# Patient Record
Sex: Female | Born: 1985 | Race: Black or African American | Hispanic: No | Marital: Single | Smoking: Former smoker
Health system: Southern US, Community
[De-identification: ages and names within clinical notes are randomized; demographics above are authoritative.]

## PROBLEM LIST (undated history)

## (undated) DIAGNOSIS — F1910 Other psychoactive substance abuse, uncomplicated: Secondary | ICD-10-CM

## (undated) HISTORY — PX: CHOLECYSTECTOMY: SHX55

---

## 2007-10-11 ENCOUNTER — Emergency Department: Payer: Self-pay | Admitting: Emergency Medicine

## 2008-07-21 ENCOUNTER — Ambulatory Visit: Payer: Self-pay | Admitting: Family Medicine

## 2008-12-19 ENCOUNTER — Observation Stay: Payer: Self-pay | Admitting: Obstetrics and Gynecology

## 2008-12-25 ENCOUNTER — Observation Stay: Payer: Self-pay | Admitting: Obstetrics and Gynecology

## 2008-12-29 ENCOUNTER — Inpatient Hospital Stay: Payer: Self-pay | Admitting: Obstetrics and Gynecology

## 2009-06-14 ENCOUNTER — Emergency Department: Payer: Self-pay | Admitting: Emergency Medicine

## 2009-11-29 ENCOUNTER — Emergency Department: Payer: Self-pay | Admitting: Unknown Physician Specialty

## 2009-12-01 ENCOUNTER — Inpatient Hospital Stay (HOSPITAL_COMMUNITY): Admission: EM | Admit: 2009-12-01 | Discharge: 2009-12-02 | Payer: Self-pay | Admitting: Emergency Medicine

## 2010-02-02 ENCOUNTER — Emergency Department: Payer: Self-pay | Admitting: Emergency Medicine

## 2010-02-03 ENCOUNTER — Emergency Department (HOSPITAL_COMMUNITY)
Admission: EM | Admit: 2010-02-03 | Discharge: 2010-02-03 | Payer: Self-pay | Source: Home / Self Care | Admitting: Emergency Medicine

## 2010-04-08 ENCOUNTER — Emergency Department: Payer: Self-pay | Admitting: Emergency Medicine

## 2010-04-09 ENCOUNTER — Emergency Department: Payer: Self-pay | Admitting: Emergency Medicine

## 2010-04-26 LAB — COMPREHENSIVE METABOLIC PANEL
ALT: 14 U/L (ref 0–35)
AST: 15 U/L (ref 0–37)
Alkaline Phosphatase: 66 U/L (ref 39–117)
CO2: 24 mEq/L (ref 19–32)
GFR calc Af Amer: >60 mL/min (ref >60)
GFR calc non Af Amer: >60 mL/min (ref >60)
Glucose, Bld: 117 mg/dL — ABNORMAL HIGH (ref 70–99)
Potassium: 3.5 mEq/L (ref 3.5–5.1)
Sodium: 139 mEq/L (ref 135–145)
Total Protein: 7.9 g/dL (ref 6.0–8.3)

## 2010-04-26 LAB — DIFFERENTIAL
Basophils Relative: 0 % (ref 0–1)
Eosinophils Absolute: 0 10*3/uL (ref 0.0–0.7)
Eosinophils Relative: 0 % (ref 0–5)
Lymphs Abs: 2.2 10*3/uL (ref 0.7–4.0)
Monocytes Relative: 6 % (ref 3–12)
Neutrophils Relative %: 75 % (ref 43–77)

## 2010-04-26 LAB — POCT PREGNANCY, URINE: Preg Test, Ur: NEGATIVE

## 2010-04-26 LAB — URINALYSIS, ROUTINE W REFLEX MICROSCOPIC
Glucose, UA: NEGATIVE mg/dL
Hgb urine dipstick: NEGATIVE
Specific Gravity, Urine: 1.038 — ABNORMAL HIGH (ref 1.005–1.030)
pH: 6 (ref 5.0–8.0)

## 2010-04-26 LAB — CBC
HCT: 37.6 % (ref 36.0–46.0)
Hemoglobin: 12.5 g/dL (ref 12.0–15.0)
WBC: 11.8 10*3/uL — ABNORMAL HIGH (ref 4.0–10.5)

## 2010-04-28 LAB — DIFFERENTIAL
Basophils Absolute: 0 10*3/uL (ref 0.0–0.1)
Basophils Relative: 0 % (ref 0–1)
Eosinophils Absolute: 0 10*3/uL (ref 0.0–0.7)
Eosinophils Relative: 0 % (ref 0–5)
Lymphocytes Relative: 22 % (ref 12–46)
Monocytes Absolute: 0.9 10*3/uL (ref 0.1–1.0)

## 2010-04-28 LAB — HEPATIC FUNCTION PANEL
ALT: 21 U/L (ref 0–35)
AST: 16 U/L (ref 0–37)
Albumin: 2.7 g/dL — ABNORMAL LOW (ref 3.5–5.2)
Bilirubin, Direct: <0.1 mg/dL (ref 0.0–0.3)
Total Bilirubin: 0.5 mg/dL (ref 0.3–1.2)
Total Bilirubin: 0.5 mg/dL (ref 0.3–1.2)

## 2010-04-28 LAB — CBC
HCT: 34.2 % — ABNORMAL LOW (ref 36.0–46.0)
HCT: 40.4 % (ref 36.0–46.0)
MCHC: 32.9 g/dL (ref 30.0–36.0)
MCV: 85.8 fL (ref 78.0–100.0)
Platelets: 263 10*3/uL (ref 150–400)
Platelets: 345 10*3/uL (ref 150–400)
RBC: 3.96 MIL/uL (ref 3.87–5.11)
RDW: 13.8 % (ref 11.5–15.5)
RDW: 13.8 % (ref 11.5–15.5)
WBC: 11.8 10*3/uL — ABNORMAL HIGH (ref 4.0–10.5)
WBC: 9.7 10*3/uL (ref 4.0–10.5)

## 2010-04-28 LAB — POCT I-STAT, CHEM 8
Chloride: 106 mEq/L (ref 96–112)
Glucose, Bld: 97 mg/dL (ref 70–99)
HCT: 43 % (ref 36.0–46.0)
Hemoglobin: 14.6 g/dL (ref 12.0–15.0)
Potassium: 3 mEq/L — ABNORMAL LOW (ref 3.5–5.1)
Sodium: 140 mEq/L (ref 135–145)

## 2010-04-28 LAB — BASIC METABOLIC PANEL
BUN: 6 mg/dL (ref 6–23)
Chloride: 109 mEq/L (ref 96–112)
GFR calc Af Amer: >60 mL/min (ref >60)
GFR calc non Af Amer: >60 mL/min (ref >60)
Potassium: 3.5 mEq/L (ref 3.5–5.1)

## 2010-04-28 LAB — LIPASE, BLOOD: Lipase: 304 U/L — ABNORMAL HIGH (ref 11–59)

## 2011-12-15 ENCOUNTER — Emergency Department (HOSPITAL_COMMUNITY)
Admission: EM | Admit: 2011-12-15 | Discharge: 2011-12-16 | Disposition: A | Discharge status: Home / Self Care | Payer: Self-pay | Attending: Emergency Medicine | Admitting: Emergency Medicine

## 2011-12-15 ENCOUNTER — Encounter (HOSPITAL_COMMUNITY): Payer: Self-pay | Admitting: *Deleted

## 2011-12-15 DIAGNOSIS — F17200 Nicotine dependence, unspecified, uncomplicated: Secondary | ICD-10-CM | POA: Insufficient documentation

## 2011-12-15 DIAGNOSIS — R112 Nausea with vomiting, unspecified: Secondary | ICD-10-CM | POA: Insufficient documentation

## 2011-12-15 DIAGNOSIS — Z3202 Encounter for pregnancy test, result negative: Secondary | ICD-10-CM | POA: Insufficient documentation

## 2011-12-15 DIAGNOSIS — R197 Diarrhea, unspecified: Secondary | ICD-10-CM | POA: Insufficient documentation

## 2011-12-15 DIAGNOSIS — R109 Unspecified abdominal pain: Secondary | ICD-10-CM | POA: Insufficient documentation

## 2011-12-15 LAB — URINALYSIS, ROUTINE W REFLEX MICROSCOPIC
Glucose, UA: NEGATIVE mg/dL
pH: 8 (ref 5.0–8.0)

## 2011-12-15 LAB — COMPREHENSIVE METABOLIC PANEL
ALT: 13 U/L (ref 0–35)
Alkaline Phosphatase: 89 U/L (ref 39–117)
CO2: 22 mEq/L (ref 19–32)
Chloride: 103 mEq/L (ref 96–112)
GFR calc Af Amer: >90 mL/min (ref >90)
GFR calc non Af Amer: >90 mL/min (ref >90)
Glucose, Bld: 125 mg/dL — ABNORMAL HIGH (ref 70–99)
Potassium: 3.2 mEq/L — ABNORMAL LOW (ref 3.5–5.1)
Sodium: 135 mEq/L (ref 135–145)
Total Bilirubin: 0.3 mg/dL (ref 0.3–1.2)
Total Protein: 8.2 g/dL (ref 6.0–8.3)

## 2011-12-15 LAB — CBC WITH DIFFERENTIAL/PLATELET
Basophils Relative: 1 % (ref 0–1)
Eosinophils Relative: 1 % (ref 0–5)
HCT: 38.2 % (ref 36.0–46.0)
Hemoglobin: 13 g/dL (ref 12.0–15.0)
Lymphocytes Relative: 19 % (ref 12–46)
Monocytes Relative: 3 % (ref 3–12)
Neutrophils Relative %: 76 % (ref 43–77)
RBC: 4.46 MIL/uL (ref 3.87–5.11)

## 2011-12-15 LAB — PREGNANCY, URINE: Preg Test, Ur: NEGATIVE

## 2011-12-15 LAB — URINE MICROSCOPIC-ADD ON

## 2011-12-15 MED ORDER — MORPHINE SULFATE 4 MG/ML IJ SOLN
6.0000 mg | Freq: Once | INTRAMUSCULAR | Status: AC
  Administered 2011-12-15: 6 mg via INTRAVENOUS
  Filled 2011-12-15: qty 2

## 2011-12-15 MED ORDER — MORPHINE SULFATE 4 MG/ML IJ SOLN
4.0000 mg | Freq: Once | INTRAMUSCULAR | Status: AC
  Administered 2011-12-15: 4 mg via INTRAVENOUS
  Filled 2011-12-15: qty 1

## 2011-12-15 MED ORDER — SODIUM CHLORIDE 0.9 % IV SOLN
1000.0000 mL | Freq: Once | INTRAVENOUS | Status: AC
  Administered 2011-12-15: 1000 mL via INTRAVENOUS

## 2011-12-15 MED ORDER — SODIUM CHLORIDE 0.9 % IV SOLN
Freq: Once | INTRAVENOUS | Status: AC
  Administered 2011-12-15: 50 mL/h via INTRAVENOUS

## 2011-12-15 MED ORDER — PROMETHAZINE HCL 25 MG PO TABS: 25.0000 mg | ORAL_TABLET | Freq: Four times a day (QID) | ORAL | Status: DC | PRN

## 2011-12-15 MED ORDER — SODIUM CHLORIDE 0.9 % IV SOLN: 1000.0000 mL | INTRAVENOUS | Status: DC

## 2011-12-15 MED ORDER — HYDROCODONE-ACETAMINOPHEN 5-325 MG PO TABS: 1.0000 | ORAL_TABLET | ORAL | Status: DC | PRN

## 2011-12-15 MED ORDER — ONDANSETRON HCL 4 MG/2ML IJ SOLN
4.0000 mg | Freq: Once | INTRAMUSCULAR | Status: AC
  Administered 2011-12-15: 4 mg via INTRAVENOUS
  Filled 2011-12-15: qty 2

## 2011-12-15 NOTE — ED Notes (Signed)
Pt c/o abd pain; vomiting x 2 days

## 2011-12-15 NOTE — ED Provider Notes (Signed)
History     CSN: [id number]  Arrival date & time 12/15/11  1955   First MD Initiated Contact with Patient 12/15/11 2005      Chief Complaint  Patient presents with  . Abdominal Pain  . Emesis    The history is provided by the patient.   The patient reports nausea vomiting and diarrhea over the past 2 days.  She's had some abdominal cramping.  She denies a history of gallstones.  She denies hematemesis.  She's had no melena or hematochezia.  She denies fevers and chills.  No recent sick contacts.  No rash.  She reports her symptoms are mild in severity.  Decreased oral intake over the past several days.  She feels generally weak.   History reviewed. No pertinent past medical history.  History reviewed. No pertinent past surgical history.  No family history on file.  History  Substance Use Topics  . Smoking status: Current Some Day Smoker    Types: Cigars  . Smokeless tobacco: Not on file  . Alcohol Use: No    OB History    Grav Para Term Preterm Abortions TAB SAB Ect Mult Living                  Review of Systems  Gastrointestinal: Positive for vomiting and abdominal pain.  All other systems reviewed and are negative.    Allergies  Review of patient's allergies indicates no known allergies.  Home Medications   Current Outpatient Rx  Name Route Sig Dispense Refill  . BISMUTH SUBSALICYLATE 262 MG/15ML PO SUSP Oral Take 15 mLs by mouth every 6 (six) hours as needed.    Marland Kitchen HYDROCODONE-ACETAMINOPHEN 5-325 MG PO TABS Oral Take 1 tablet by mouth every 4 (four) hours as needed for pain. 8 tablet 0  . PROMETHAZINE HCL 25 MG PO TABS Oral Take 1 tablet (25 mg total) by mouth every 6 (six) hours as needed for nausea. 10 tablet 0    BP 164/84  Pulse 48  Temp 98.6 F (37 C)  Resp 20  SpO2 100%  LMP 12/15/2011  Physical Exam  Nursing note and vitals reviewed. Constitutional: She is oriented to person, place, and time. She appears well-developed and well-nourished.  No distress.  HENT:  Head: Normocephalic and atraumatic.  Eyes: EOM are normal.  Neck: Normal range of motion.  Cardiovascular: Normal rate, regular rhythm and normal heart sounds.   Pulmonary/Chest: Effort normal and breath sounds normal.  Abdominal: Soft. She exhibits no distension. There is no tenderness. There is no rebound and no guarding.       Morbidly obese  Musculoskeletal: Normal range of motion.  Neurological: She is alert and oriented to person, place, and time.  Skin: Skin is warm and dry.  Psychiatric: She has a normal mood and affect. Judgment normal.    ED Course  Procedures (including critical care time)  Labs Reviewed  URINALYSIS, ROUTINE W REFLEX MICROSCOPIC - Abnormal; Notable for the following:    Color, Urine RED (*)  BIOCHEMICALS MAY BE AFFECTED BY COLOR   APPearance CLOUDY (*)     Hgb urine dipstick LARGE (*)     Ketones, ur 40 (*)     Protein, ur 100 (*)     Leukocytes, UA SMALL (*)     All other components within normal limits  COMPREHENSIVE METABOLIC PANEL - Abnormal; Notable for the following:    Potassium 3.2 (*)     Glucose, Bld 125 (*)  Albumin 3.3 (*)     All other components within normal limits  URINE MICROSCOPIC-ADD ON - Abnormal; Notable for the following:    Squamous Epithelial / LPF FEW (*)     Bacteria, UA FEW (*)     All other components within normal limits  PREGNANCY, URINE  CBC WITH DIFFERENTIAL  LIPASE, BLOOD  URINE CULTURE   No results found.   1. Nausea vomiting and diarrhea       MDM  The patient is well-appearing.  Her abdominal exam is benign.  Bowel sounds are normal.  Labs are without significant abnormalities.  The patient feels better after IV fluids.  Home with nausea medicine.  I decided to send the patient home with a short course of Vicodin as she seems to BE affected by her abdominal cramping.  Repeat abdominal exam is benign.        Lyanne Co, MD 12/15/11 [phone]

## 2011-12-17 ENCOUNTER — Encounter (HOSPITAL_COMMUNITY): Payer: Self-pay | Admitting: Emergency Medicine

## 2011-12-17 ENCOUNTER — Emergency Department (HOSPITAL_COMMUNITY): Payer: Self-pay

## 2011-12-17 ENCOUNTER — Emergency Department (HOSPITAL_COMMUNITY)
Admission: EM | Admit: 2011-12-17 | Discharge: 2011-12-17 | Disposition: A | Discharge status: Home / Self Care | Payer: Self-pay | Attending: Emergency Medicine | Admitting: Emergency Medicine

## 2011-12-17 DIAGNOSIS — R109 Unspecified abdominal pain: Secondary | ICD-10-CM | POA: Insufficient documentation

## 2011-12-17 DIAGNOSIS — F17200 Nicotine dependence, unspecified, uncomplicated: Secondary | ICD-10-CM | POA: Insufficient documentation

## 2011-12-17 DIAGNOSIS — R1110 Vomiting, unspecified: Secondary | ICD-10-CM

## 2011-12-17 DIAGNOSIS — M25519 Pain in unspecified shoulder: Secondary | ICD-10-CM | POA: Insufficient documentation

## 2011-12-17 DIAGNOSIS — Z3202 Encounter for pregnancy test, result negative: Secondary | ICD-10-CM | POA: Insufficient documentation

## 2011-12-17 DIAGNOSIS — R112 Nausea with vomiting, unspecified: Secondary | ICD-10-CM | POA: Insufficient documentation

## 2011-12-17 DIAGNOSIS — R079 Chest pain, unspecified: Secondary | ICD-10-CM | POA: Insufficient documentation

## 2011-12-17 LAB — TROPONIN I
Troponin I: <0.3 ng/mL (ref <0.30)
Troponin I: <0.3 ng/mL (ref <0.30)

## 2011-12-17 LAB — URINALYSIS, ROUTINE W REFLEX MICROSCOPIC
Ketones, ur: 40 mg/dL — AB
Leukocytes, UA: NEGATIVE
Nitrite: NEGATIVE
Protein, ur: NEGATIVE mg/dL
Urobilinogen, UA: 1 mg/dL (ref 0.0–1.0)

## 2011-12-17 LAB — CBC WITH DIFFERENTIAL/PLATELET
Basophils Absolute: 0 10*3/uL (ref 0.0–0.1)
Basophils Relative: 0 % (ref 0–1)
HCT: 37.4 % (ref 36.0–46.0)
MCHC: 34.5 g/dL (ref 30.0–36.0)
Monocytes Absolute: 0.5 10*3/uL (ref 0.1–1.0)
Neutro Abs: 5.1 10*3/uL (ref 1.7–7.7)
Neutrophils Relative %: 65 % (ref 43–77)
Platelets: 241 10*3/uL (ref 150–400)
RDW: 13 % (ref 11.5–15.5)

## 2011-12-17 LAB — COMPREHENSIVE METABOLIC PANEL
AST: 20 U/L (ref 0–37)
Albumin: 3.3 g/dL — ABNORMAL LOW (ref 3.5–5.2)
Chloride: 106 mEq/L (ref 96–112)
Creatinine, Ser: 0.73 mg/dL (ref 0.50–1.10)
Total Bilirubin: 0.2 mg/dL — ABNORMAL LOW (ref 0.3–1.2)

## 2011-12-17 LAB — URINE CULTURE

## 2011-12-17 MED ORDER — ONDANSETRON HCL 4 MG/2ML IJ SOLN
4.0000 mg | Freq: Once | INTRAMUSCULAR | Status: AC
  Administered 2011-12-17: 4 mg via INTRAVENOUS
  Filled 2011-12-17: qty 2

## 2011-12-17 MED ORDER — IOHEXOL 300 MG/ML  SOLN
100.0000 mL | Freq: Once | INTRAMUSCULAR | Status: AC | PRN
  Administered 2011-12-17: 100 mL via INTRAVENOUS

## 2011-12-17 MED ORDER — HYDROMORPHONE HCL PF 1 MG/ML IJ SOLN
1.0000 mg | Freq: Once | INTRAMUSCULAR | Status: AC
  Administered 2011-12-17: 1 mg via INTRAVENOUS
  Filled 2011-12-17: qty 1

## 2011-12-17 MED ORDER — OXYCODONE-ACETAMINOPHEN 5-325 MG PO TABS: 2.0000 | ORAL_TABLET | ORAL | Status: DC | PRN

## 2011-12-17 MED ORDER — SODIUM CHLORIDE 0.9 % IV BOLUS (SEPSIS): 500.0000 mL | Freq: Once | INTRAVENOUS | Status: DC

## 2011-12-17 MED ORDER — ONDANSETRON 4 MG PO TBDP: 4.0000 mg | ORAL_TABLET | Freq: Three times a day (TID) | ORAL | Status: DC | PRN

## 2011-12-17 MED ORDER — MORPHINE SULFATE 4 MG/ML IJ SOLN
4.0000 mg | Freq: Once | INTRAMUSCULAR | Status: AC
  Administered 2011-12-17: 4 mg via INTRAVENOUS
  Filled 2011-12-17: qty 1

## 2011-12-17 NOTE — ED Provider Notes (Signed)
Medical screening examination/treatment/procedure(s) were performed by non-physician practitioner and as supervising physician I was immediately available for consultation/collaboration.   Richardean Canal, MD 12/17/11 [phone]

## 2011-12-17 NOTE — ED Notes (Signed)
Pt was seen here on 10/31 for same sx--abd pain that radiates to chest, nausea, vomiting.  Was told to come back if she didn't feel any better.  States she can't keep anything down.  Pain 10/10.  Generalized abd pain.  Pt is morbidly obese.

## 2011-12-17 NOTE — ED Notes (Signed)
Patient denies pain and is resting comfortably.  

## 2011-12-17 NOTE — ED Provider Notes (Signed)
History     CSN: [id number]  Arrival date & time 12/17/11  4098   First MD Initiated Contact with Patient 12/17/11 1034      Chief Complaint  Patient presents with  . Abdominal Pain  . Chest Pain  . Emesis  . Nausea    (Consider location/radiation/quality/duration/timing/severity/associated sxs/prior treatment) HPI Comments: Pt states that she started with upper abdominal cramping 4 days ago and was seen 3 days in the er and she states that she started to feel better but she started to vomit this morning again and now the pain is radiating to the right shoulder:pt states that the pain is worse with breathing and movement:denies hematemesis:pt last bm 3 days ago:pt denies JXB:JYNWGN history of similar symptoms  Patient is a 26 y.o. female presenting with abdominal pain. The history is provided by the patient. No language interpreter was used.  Abdominal Pain The primary symptoms of the illness include abdominal pain, nausea and vomiting. The primary symptoms of the illness do not include fever or shortness of breath. The current episode started more than 2 days ago. The onset of the illness was gradual. The problem has not changed since onset. The patient states that she believes she is currently not pregnant. The patient has not had a change in bowel habit. Symptoms associated with the illness do not include urgency, frequency or back pain.    No past medical history on file.  Past Surgical History  Procedure Date  . Cholecystectomy   . Cesarean section     No family history on file.  History  Substance Use Topics  . Smoking status: Current Some Day Smoker    Types: Cigars  . Smokeless tobacco: Not on file  . Alcohol Use: No    OB History    Grav Para Term Preterm Abortions TAB SAB Ect Mult Living                  Review of Systems  Constitutional: Negative for fever.  Respiratory: Negative for shortness of breath.   Cardiovascular: Positive for chest pain.    Gastrointestinal: Positive for nausea, vomiting and abdominal pain.  Genitourinary: Negative for urgency and frequency.  Musculoskeletal: Negative for back pain.  Neurological: Negative for dizziness.    Allergies  Review of patient's allergies indicates no known allergies.  Home Medications   Current Outpatient Rx  Name Route Sig Dispense Refill  . BISMUTH SUBSALICYLATE 262 MG/15ML PO SUSP Oral Take 15 mLs by mouth every 6 (six) hours as needed. For nausea    . HYDROCODONE-ACETAMINOPHEN 5-325 MG PO TABS Oral Take 1 tablet by mouth every 4 (four) hours as needed for pain. 8 tablet 0  . PROMETHAZINE HCL 25 MG PO TABS Oral Take 1 tablet (25 mg total) by mouth every 6 (six) hours as needed for nausea. 10 tablet 0    BP 112/82  Pulse 46  Temp 98.4 F (36.9 C) (Oral)  Resp 18  SpO2 100%  LMP 12/15/2011  Physical Exam  Nursing note and vitals reviewed. Constitutional: She is oriented to person, place, and time. She appears well-developed and well-nourished.  HENT:  Head: Normocephalic and atraumatic.  Eyes: Conjunctivae normal are normal.  Neck: Normal range of motion. Neck supple.  Cardiovascular: Normal rate and regular rhythm.   Pulmonary/Chest: Effort normal and breath sounds normal. She exhibits no tenderness.  Abdominal: Soft. Bowel sounds are normal. There is no tenderness.  Musculoskeletal: Normal range of motion.  Neurological: She is alert  and oriented to person, place, and time.  Skin: Skin is warm and dry.  Psychiatric: She has a normal mood and affect.    ED Course  Procedures (including critical care time)  Labs Reviewed  URINALYSIS, ROUTINE W REFLEX MICROSCOPIC - Abnormal; Notable for the following:    Bilirubin Urine SMALL (*)     Ketones, ur 40 (*)     All other components within normal limits  COMPREHENSIVE METABOLIC PANEL - Abnormal; Notable for the following:    Potassium 3.4 (*)  SLIGHT HEMOLYSIS   Albumin 3.3 (*)     Total Bilirubin 0.2 (*)      All other components within normal limits  CBC WITH DIFFERENTIAL  LIPASE, BLOOD  TROPONIN I  TROPONIN I  PREGNANCY, URINE   Ct Abdomen Pelvis W Contrast  12/17/2011  *RADIOLOGY REPORT*  Clinical Data: Abdominal pain, nausea, vomiting  CT ABDOMEN AND PELVIS WITH CONTRAST  Technique:  Multidetector CT imaging of the abdomen and pelvis was performed following the standard protocol during bolus administration of intravenous contrast.  Contrast: OMNIPAQUE IOHEXOL 300 MG/ML  SOLN  Comparison: 02/03/2010  Findings: Minimal patchy basilar atelectasis and nonspecific ground- glass attenuation.  No lower lobe pneumonia or consolidation. Normal heart size.  No pericardial or pleural effusion.  Abdomen:  Prior cholecystectomy noted.  Liver, biliary system, pancreas, spleen, adrenal glands, kidneys are within normal limits for age and demonstrate no acute process.  Negative for free fluid, fluid collection, hemorrhage, abscess, or adenopathy.  No bowel obstruction pattern, dilatation, ileus, or free air. Terminal ileum and appendix are unremarkable.  Pelvis:  Urinary bladder is underdistended.  Uterus and ovaries normal in size.  No distal colon abnormality.  No pelvic free fluid, fluid collection, hemorrhage, adenopathy, abscess, or inguinal abnormality.  No acute osseous finding.  IMPRESSION: Stable exam.  No acute intra-abdominal or pelvic process.   Original Report Authenticated By: Judie Petit. Miles Costain, M.D.     Date: 12/17/2011  Rate:46  Rhythm: sinus bradycardia  QRS Axis: normal  Intervals: normal  ST/T Wave abnormalities: normal  Conduction Disutrbances:none  Narrative Interpretation:   Old EKG Reviewed: none available    1. Abdominal pain   2. Shoulder pain   3. Chest pain   4. Vomiting       MDM  Pt symptoms have resolved at this time:pt is tolerating po at this time:abdomen non surgical:pt is okay to go home with zofran and percocet:pt chest pain likely referred pain        Teressa Lower, NP 12/17/11 1557

## 2011-12-17 NOTE — ED Notes (Signed)
Reports  abdominal pain onset (10-30). Had diarrhea for a week which stopped on Wed.  Seen @ [facility] ED on   12-15-11 given Rx for vicodin and nausea. Took pain & nausea medication 4 hours ago without relief. Reports abdominal pain all over goes to chest and right shoulder. Rated pain 10/10.

## 2011-12-17 NOTE — ED Notes (Signed)
22fr. I & O cath done.

## 2011-12-20 ENCOUNTER — Emergency Department (HOSPITAL_COMMUNITY)
Admission: EM | Admit: 2011-12-20 | Discharge: 2011-12-20 | Disposition: A | Discharge status: Home / Self Care | Payer: Self-pay | Attending: Emergency Medicine | Admitting: Emergency Medicine

## 2011-12-20 ENCOUNTER — Encounter (HOSPITAL_COMMUNITY): Payer: Self-pay | Admitting: *Deleted

## 2011-12-20 DIAGNOSIS — R1084 Generalized abdominal pain: Secondary | ICD-10-CM | POA: Insufficient documentation

## 2011-12-20 DIAGNOSIS — F17200 Nicotine dependence, unspecified, uncomplicated: Secondary | ICD-10-CM | POA: Insufficient documentation

## 2011-12-20 DIAGNOSIS — R112 Nausea with vomiting, unspecified: Secondary | ICD-10-CM | POA: Insufficient documentation

## 2011-12-20 DIAGNOSIS — R197 Diarrhea, unspecified: Secondary | ICD-10-CM | POA: Insufficient documentation

## 2011-12-20 LAB — CBC WITH DIFFERENTIAL/PLATELET
Lymphocytes Relative: 20 % (ref 12–46)
Lymphs Abs: 2.1 10*3/uL (ref 0.7–4.0)
MCV: 84.9 fL (ref 78.0–100.0)
Neutro Abs: 7.6 10*3/uL (ref 1.7–7.7)
Neutrophils Relative %: 74 % (ref 43–77)
Platelets: 317 10*3/uL (ref 150–400)
RBC: 4.7 MIL/uL (ref 3.87–5.11)
WBC: 10.3 10*3/uL (ref 4.0–10.5)

## 2011-12-20 LAB — URINALYSIS, ROUTINE W REFLEX MICROSCOPIC
Bilirubin Urine: NEGATIVE
Ketones, ur: >80 mg/dL — AB
Nitrite: NEGATIVE
pH: 7.5 (ref 5.0–8.0)

## 2011-12-20 LAB — COMPREHENSIVE METABOLIC PANEL
ALT: 36 U/L — ABNORMAL HIGH (ref 0–35)
AST: 25 U/L (ref 0–37)
Albumin: 3.8 g/dL (ref 3.5–5.2)
Alkaline Phosphatase: 81 U/L (ref 39–117)
BUN: 6 mg/dL (ref 6–23)
CO2: 23 mEq/L (ref 19–32)
Calcium: 9.3 mg/dL (ref 8.4–10.5)
Chloride: 101 mEq/L (ref 96–112)
Creatinine, Ser: 0.8 mg/dL (ref 0.50–1.10)
GFR calc Af Amer: >90 mL/min (ref >90)
GFR calc non Af Amer: >90 mL/min (ref >90)
Glucose, Bld: 92 mg/dL (ref 70–99)
Potassium: 3 mEq/L — ABNORMAL LOW (ref 3.5–5.1)
Sodium: 138 mEq/L (ref 135–145)
Total Bilirubin: 0.3 mg/dL (ref 0.3–1.2)
Total Protein: 8.4 g/dL — ABNORMAL HIGH (ref 6.0–8.3)

## 2011-12-20 MED ORDER — MORPHINE SULFATE 4 MG/ML IJ SOLN
4.0000 mg | Freq: Once | INTRAMUSCULAR | Status: AC
  Administered 2011-12-20: 4 mg via INTRAVENOUS
  Filled 2011-12-20: qty 1

## 2011-12-20 MED ORDER — SODIUM CHLORIDE 0.9 % IV BOLUS (SEPSIS)
1000.0000 mL | Freq: Once | INTRAVENOUS | Status: AC
  Administered 2011-12-20: 1000 mL via INTRAVENOUS

## 2011-12-20 MED ORDER — HYDROMORPHONE HCL PF 1 MG/ML IJ SOLN
1.0000 mg | Freq: Once | INTRAMUSCULAR | Status: AC
  Administered 2011-12-20: 1 mg via INTRAVENOUS
  Filled 2011-12-20: qty 1

## 2011-12-20 MED ORDER — ONDANSETRON HCL 4 MG/2ML IJ SOLN
4.0000 mg | Freq: Once | INTRAMUSCULAR | Status: AC
  Administered 2011-12-20: 4 mg via INTRAVENOUS
  Filled 2011-12-20: qty 2

## 2011-12-20 MED ORDER — METOCLOPRAMIDE HCL 10 MG PO TABS: 10.0000 mg | ORAL_TABLET | Freq: Four times a day (QID) | ORAL | Status: DC

## 2011-12-20 MED ORDER — POTASSIUM CHLORIDE 10 MEQ/100ML IV SOLN
10.0000 meq | Freq: Once | INTRAVENOUS | Status: AC
  Administered 2011-12-20: 10 meq via INTRAVENOUS
  Filled 2011-12-20: qty 100

## 2011-12-20 MED ORDER — OXYCODONE-ACETAMINOPHEN 5-325 MG PO TABS: 1.0000 | ORAL_TABLET | Freq: Four times a day (QID) | ORAL | Status: DC | PRN

## 2011-12-20 NOTE — ED Notes (Signed)
Pt states she was here last Thursday and Saturday for abdominal pain, diarrhea, nausea and vomiting, pt states the abdominal pain is getting worse and states still having constant nausea and vomiting, states diarrhea comes and goes, pt in tears d/t pain, pt states abdominal pain is radiating around to back area, pt denies urinary symptoms. Pt states was sent home w/ percocet and zofran but it is not helping. Pt states she will start to feel better and be able to keep fluids down, then she will eat something like crackers and then the abdominal pain comes back and the has n/v.

## 2011-12-20 NOTE — ED Notes (Signed)
Pt changing into clothes, will monitor.

## 2011-12-20 NOTE — ED Provider Notes (Signed)
History     CSN: [id number]  Arrival date & time 12/20/11  5784   First MD Initiated Contact with Patient 12/20/11 [phone]      Chief Complaint  Patient presents with  . Nausea  . Emesis  . Abdominal Pain  . Diarrhea    (Consider location/radiation/quality/duration/timing/severity/associated sxs/prior treatment) HPI Comments: Patient comes in with a chief complaint of abdominal pain, nausea, vomiting, and diarrhea.  Symptoms began six days ago.  This is her third visit to the ED since the onset of symptoms.   Abdominal pain is generalized.  She describes it as a sharp cramping pain.  Pain has been constant over the past six days and is gradually worsening.  She has taken Percocet for the pain, which does help somewhat.  She has also been taking Phenergan for the nausea, which she does not feel helps.  She had a CT ab/pelvis with contrast performed three days ago, which was negative.  She denies blood in her emesis or blood in her stool.  Denies fever or chills.  Past surgical history includes Cholecystectomy.    Patient is a 26 y.o. female presenting with abdominal pain. The history is provided by the patient.  Abdominal Pain The primary symptoms of the illness include abdominal pain, nausea, vomiting and diarrhea. The primary symptoms of the illness do not include fever, shortness of breath, hematemesis, hematochezia, dysuria, vaginal discharge or vaginal bleeding.  The patient states that she believes she is currently not pregnant. Symptoms associated with the illness do not include constipation.    History reviewed. No pertinent past medical history.  Past Surgical History  Procedure Date  . Cholecystectomy   . Cesarean section     History reviewed. No pertinent family history.  History  Substance Use Topics  . Smoking status: Current Some Day Smoker    Types: Cigars  . Smokeless tobacco: Not on file  . Alcohol Use: No    OB History    Grav Para Term Preterm Abortions TAB  SAB Ect Mult Living                  Review of Systems  Constitutional: Negative for fever.  Respiratory: Negative for shortness of breath.   Gastrointestinal: Positive for nausea, vomiting, abdominal pain and diarrhea. Negative for constipation, blood in stool, hematochezia, abdominal distention and hematemesis.  Genitourinary: Negative for dysuria, vaginal bleeding, vaginal discharge, difficulty urinating and pelvic pain.  Neurological: Negative for syncope and light-headedness.    Allergies  Review of patient's allergies indicates no known allergies.  Home Medications   Current Outpatient Rx  Name  Route  Sig  Dispense  Refill  . BISMUTH SUBSALICYLATE 262 MG/15ML PO SUSP   Oral   Take 15 mLs by mouth every 6 (six) hours as needed. For nausea         . HYDROCODONE-ACETAMINOPHEN 5-325 MG PO TABS   Oral   Take 1 tablet by mouth every 4 (four) hours as needed for pain.   8 tablet   0   . OXYCODONE-ACETAMINOPHEN 5-325 MG PO TABS   Oral   Take 2 tablets by mouth every 4 (four) hours as needed for pain.   6 tablet   0   . PROMETHAZINE HCL 25 MG PO TABS   Oral   Take 1 tablet (25 mg total) by mouth every 6 (six) hours as needed for nausea.   10 tablet   0     BP 159/103  Pulse 72  Temp 97.8 F (36.6 C) (Oral)  Resp 18  SpO2 99%  LMP 12/15/2011  Physical Exam  Nursing note and vitals reviewed. Constitutional: She appears well-developed and well-nourished. No distress.  HENT:  Head: Normocephalic and atraumatic.  Mouth/Throat: Oropharynx is clear and moist.  Neck: Normal range of motion. Neck supple.  Cardiovascular: Normal rate, regular rhythm and normal heart sounds.   Pulmonary/Chest: Effort normal and breath sounds normal.  Abdominal: Soft. Bowel sounds are normal. She exhibits no mass. There is no rigidity, no rebound and no guarding.       Obese abdomen Mild generalized tenderness to palpation  Neurological: She is alert.  Skin: Skin is warm and dry.  She is not diaphoretic.  Psychiatric: She has a normal mood and affect.    ED Course  Procedures (including critical care time)   Labs Reviewed  CBC WITH DIFFERENTIAL  COMPREHENSIVE METABOLIC PANEL  URINALYSIS, ROUTINE W REFLEX MICROSCOPIC  PREGNANCY, URINE  LIPASE, BLOOD   No results found.   No diagnosis found.  7:44 AM Reassessed patient.  She reports that her pain and nausea have not improved.  Will order another dose of pain medication and antiemetics.    8:27 AM Patient reports that her pain and her nausea has improved somewhat.  9:33 AM Reassessed patient.  Nausea and abdominal pain have resolved at this time.  Patient able to tolerate po liquids.  MDM  Patient presenting today with nausea, vomiting, diarrhea, and diffuse abdominal pain.  Patient had a CT ab/pelvis performed 3 days ago for these same symptoms, which was negative.  PMH significant for Cholecystectomy.  Patient is afebrile.  Abdominal exam is benign at this time.  Labs unremarkable aside from mild Hypokalemia that was corrected with Potassium.  Patient discharged home and instructed to follow up with PCP.  Patient also given GI follow up if symptoms persist.        Magnus Sinning, PA-C 12/20/11 1701

## 2011-12-20 NOTE — ED Notes (Signed)
Okayed to give pt something to drink per [place], PA.

## 2011-12-20 NOTE — ED Notes (Signed)
PA at bedside.

## 2011-12-20 NOTE — ED Provider Notes (Signed)
Medical screening examination/treatment/procedure(s) were performed by non-physician practitioner and as supervising physician I was immediately available for consultation/collaboration.   Lyanne Co, MD 12/20/11 2206

## 2011-12-24 ENCOUNTER — Emergency Department (HOSPITAL_COMMUNITY): Payer: Self-pay

## 2011-12-24 ENCOUNTER — Encounter (HOSPITAL_COMMUNITY): Payer: Self-pay | Admitting: Emergency Medicine

## 2011-12-24 ENCOUNTER — Emergency Department (HOSPITAL_COMMUNITY)
Admission: EM | Admit: 2011-12-24 | Discharge: 2011-12-24 | Disposition: A | Discharge status: Home / Self Care | Payer: Self-pay | Attending: Emergency Medicine | Admitting: Emergency Medicine

## 2011-12-24 DIAGNOSIS — R109 Unspecified abdominal pain: Secondary | ICD-10-CM | POA: Insufficient documentation

## 2011-12-24 DIAGNOSIS — F17200 Nicotine dependence, unspecified, uncomplicated: Secondary | ICD-10-CM | POA: Insufficient documentation

## 2011-12-24 DIAGNOSIS — R1084 Generalized abdominal pain: Secondary | ICD-10-CM

## 2011-12-24 DIAGNOSIS — Z9089 Acquired absence of other organs: Secondary | ICD-10-CM | POA: Insufficient documentation

## 2011-12-24 LAB — CBC WITH DIFFERENTIAL/PLATELET
Basophils Absolute: 0 10*3/uL (ref 0.0–0.1)
Eosinophils Relative: 0 % (ref 0–5)
HCT: 37.4 % (ref 36.0–46.0)
Lymphocytes Relative: 28 % (ref 12–46)
Lymphs Abs: 2.6 10*3/uL (ref 0.7–4.0)
MCV: 86 fL (ref 78.0–100.0)
Monocytes Absolute: 0.7 10*3/uL (ref 0.1–1.0)
RDW: 13.3 % (ref 11.5–15.5)
WBC: 9.2 10*3/uL (ref 4.0–10.5)

## 2011-12-24 LAB — URINALYSIS, ROUTINE W REFLEX MICROSCOPIC
Glucose, UA: NEGATIVE mg/dL
Hgb urine dipstick: NEGATIVE
Ketones, ur: 15 mg/dL — AB
pH: 7 (ref 5.0–8.0)

## 2011-12-24 LAB — POCT I-STAT, CHEM 8
BUN: <3 mg/dL — ABNORMAL LOW (ref 6–23)
Chloride: 101 mEq/L (ref 96–112)
Creatinine, Ser: 0.9 mg/dL (ref 0.50–1.10)
Glucose, Bld: 101 mg/dL — ABNORMAL HIGH (ref 70–99)
Potassium: 4 mEq/L (ref 3.5–5.1)

## 2011-12-24 MED ORDER — DICYCLOMINE HCL 10 MG PO CAPS
10.0000 mg | ORAL_CAPSULE | Freq: Once | ORAL | Status: AC
  Administered 2011-12-24: 10 mg via ORAL
  Filled 2011-12-24: qty 1

## 2011-12-24 MED ORDER — SODIUM CHLORIDE 0.9 % IV BOLUS (SEPSIS)
1000.0000 mL | Freq: Once | INTRAVENOUS | Status: AC
  Administered 2011-12-24: 1000 mL via INTRAVENOUS

## 2011-12-24 MED ORDER — HYDROMORPHONE HCL PF 1 MG/ML IJ SOLN
1.0000 mg | Freq: Once | INTRAMUSCULAR | Status: AC
  Administered 2011-12-24: 1 mg via INTRAVENOUS
  Filled 2011-12-24: qty 1

## 2011-12-24 MED ORDER — ONDANSETRON HCL 4 MG/2ML IJ SOLN
4.0000 mg | INTRAMUSCULAR | Status: DC | PRN
  Administered 2011-12-24: 4 mg via INTRAVENOUS
  Filled 2011-12-24: qty 2

## 2011-12-24 NOTE — ED Provider Notes (Signed)
History     CSN: [id number]  Arrival date & time 12/24/11  4098   First MD Initiated Contact with Patient 12/24/11 0215      Chief Complaint  Patient presents with  . Abdominal Pain    (Consider location/radiation/quality/duration/timing/severity/associated sxs/prior treatment) HPI Comments: 26 year old morbidly obese female with a history of cholecystectomy presents emergency department with chief complaint of diffuse abdominal pain.  Patient has been evaluated in the ER on 10/31, 11/2, and 11/5 with CT abdomen pelvis showing no acute abnormalities.  Patient states that her pain started in over a week ago and described as diffuse abdominal cramping in her mid section associated with nausea, vomiting, and diarrhea.  Patient states that she is unable to keep any food down and that the pain medications she was given are not helping.  When asked if she is seen GI specialist patient states she has been unable to make an appointment with them.  Patient denies any new symptoms including dysuria, vaginal discharge, lower abdominal pain, change in bowel movements, hematochezia, hematemesis, melena, fevers, night sweats or chills.  Patient is a 26 y.o. female presenting with abdominal pain. The history is provided by the patient.  Abdominal Pain The primary symptoms of the illness include abdominal pain.    History reviewed. No pertinent past medical history.  Past Surgical History  Procedure Date  . Cholecystectomy   . Cesarean section     No family history on file.  History  Substance Use Topics  . Smoking status: Current Some Day Smoker    Types: Cigars  . Smokeless tobacco: Not on file  . Alcohol Use: No    OB History    Grav Para Term Preterm Abortions TAB SAB Ect Mult Living                  Review of Systems  Gastrointestinal: Positive for abdominal pain.    Allergies  Review of patient's allergies indicates no known allergies.  Home Medications   Current  Outpatient Rx  Name  Route  Sig  Dispense  Refill  . OXYCODONE-ACETAMINOPHEN 5-325 MG PO TABS   Oral   Take 2 tablets by mouth every 4 (four) hours as needed for pain.   6 tablet   0   . PROMETHAZINE HCL 25 MG PO TABS   Oral   Take 1 tablet (25 mg total) by mouth every 6 (six) hours as needed for nausea.   10 tablet   0     BP 160/95  Pulse 60  Temp 98 F (36.7 C) (Oral)  Resp 16  Wt 300 lb (136.079 kg)  SpO2 99%  LMP 12/15/2011  Physical Exam  Nursing note and vitals reviewed. Constitutional: Vital signs are normal. She appears well-developed and well-nourished. No distress.  HENT:  Head: Normocephalic and atraumatic.  Mouth/Throat: Uvula is midline, oropharynx is clear and moist and mucous membranes are normal.  Eyes: Conjunctivae normal and EOM are normal. Pupils are equal, round, and reactive to light.  Neck: Normal range of motion and full passive range of motion without pain. Neck supple. No spinous process tenderness and no muscular tenderness present. No rigidity. No Brudzinski's sign noted.  Cardiovascular: Normal rate and regular rhythm.   Pulmonary/Chest: Effort normal and breath sounds normal. No accessory muscle usage. Not tachypneic. No respiratory distress.  Abdominal: Soft. Normal appearance. She exhibits no distension, no ascites, no pulsatile midline mass and no mass. There is tenderness. There is no CVA tenderness. No hernia.  mobidly obese abdomen with diffuse mild tenderness on deep palpation of mid abdomen. No lower abdominal ttp. Bowel sounds present & normal  Lymphadenopathy:    She has no cervical adenopathy.  Neurological: She is alert.  Skin: Skin is warm and dry. No rash noted. She is not diaphoretic.  Psychiatric: She has a normal mood and affect. Her speech is normal and behavior is normal.    ED Course  Procedures (including critical care time)  Labs Reviewed  URINALYSIS, ROUTINE W REFLEX MICROSCOPIC - Abnormal; Notable for the  following:    APPearance CLOUDY (*)     Bilirubin Urine SMALL (*)     Ketones, ur 15 (*)     All other components within normal limits  POCT I-STAT, CHEM 8 - Abnormal; Notable for the following:    BUN <3 (*)     Glucose, Bld 101 (*)     Calcium, Ion 1.08 (*)     All other components within normal limits  CBC WITH DIFFERENTIAL  POCT PREGNANCY, URINE   No results found.   No diagnosis found.  BP 160/95  Pulse 60  Temp 98 F (36.7 C) (Oral)  Resp 16  Wt 300 lb (136.079 kg)  SpO2 99%  LMP 12/15/2011   MDM  Diffuse mid abd cramping/pain  26 year old obese female presents to the emergency department for the fourth time since October 31 complaining of mid abdominal cramping and pain with associated nausea, vomiting, and diarrhea. Past evaluation included a normal CT abdomen.  Patient is status post cholecystectomy.  Acute abdominal series was no free intra-abdominal air pattern.  Exam non-concerning for surgical abdomen.  Discussed with patient the need for further evaluation by primary care and gastroenterologist as emergency department workup has been unable to find exact etiology of pain.  Pain treated in the emergency department as well as fluids given. Labs and imaging reviewed w no acute abnormalities. Pt appears non toxic w nl vitals. Advised follow up as above.        Jaci Carrel, [place] 12/24/11 [phone]

## 2011-12-24 NOTE — ED Notes (Signed)
Pt alert, arrives from home, c/o nausea, abd pain, onset unknown, seen several times recently with same c/o, resp even unlabored, skin pwd

## 2011-12-24 NOTE — ED Provider Notes (Signed)
Medical screening examination/treatment/procedure(s) were performed by non-physician practitioner and as supervising physician I was immediately available for consultation/collaboration.  Katherina Wimer K Zakiah Gauthreaux-Rasch, MD 12/24/11 0409 

## 2012-02-17 ENCOUNTER — Emergency Department: Payer: Self-pay | Admitting: Emergency Medicine

## 2012-04-30 ENCOUNTER — Encounter (HOSPITAL_COMMUNITY): Payer: Self-pay | Admitting: Emergency Medicine

## 2012-04-30 ENCOUNTER — Emergency Department (HOSPITAL_COMMUNITY)
Admission: EM | Admit: 2012-04-30 | Discharge: 2012-04-30 | Disposition: A | Discharge status: Home / Self Care | Payer: Self-pay | Attending: Emergency Medicine | Admitting: Emergency Medicine

## 2012-04-30 DIAGNOSIS — K529 Noninfective gastroenteritis and colitis, unspecified: Secondary | ICD-10-CM

## 2012-04-30 DIAGNOSIS — Z3202 Encounter for pregnancy test, result negative: Secondary | ICD-10-CM | POA: Insufficient documentation

## 2012-04-30 DIAGNOSIS — F17200 Nicotine dependence, unspecified, uncomplicated: Secondary | ICD-10-CM | POA: Insufficient documentation

## 2012-04-30 DIAGNOSIS — K5289 Other specified noninfective gastroenteritis and colitis: Secondary | ICD-10-CM | POA: Insufficient documentation

## 2012-04-30 DIAGNOSIS — R110 Nausea: Secondary | ICD-10-CM | POA: Insufficient documentation

## 2012-04-30 LAB — COMPREHENSIVE METABOLIC PANEL
Albumin: 3.6 g/dL (ref 3.5–5.2)
BUN: 7 mg/dL (ref 6–23)
Chloride: 97 mEq/L (ref 96–112)
Creatinine, Ser: 0.73 mg/dL (ref 0.50–1.10)
GFR calc Af Amer: >90 mL/min (ref >90)
Glucose, Bld: 99 mg/dL (ref 70–99)
Total Bilirubin: 0.4 mg/dL (ref 0.3–1.2)
Total Protein: 8.3 g/dL (ref 6.0–8.3)

## 2012-04-30 LAB — URINALYSIS, ROUTINE W REFLEX MICROSCOPIC
Ketones, ur: 15 mg/dL — AB
Nitrite: NEGATIVE
Protein, ur: NEGATIVE mg/dL
Urobilinogen, UA: 1 mg/dL (ref 0.0–1.0)
pH: 5.5 (ref 5.0–8.0)

## 2012-04-30 LAB — CBC WITH DIFFERENTIAL/PLATELET
Basophils Relative: 0 % (ref 0–1)
Eosinophils Absolute: 0 10*3/uL (ref 0.0–0.7)
HCT: 43 % (ref 36.0–46.0)
Hemoglobin: 14.7 g/dL (ref 12.0–15.0)
Lymphs Abs: 2.5 10*3/uL (ref 0.7–4.0)
MCH: 29.9 pg (ref 26.0–34.0)
MCHC: 34.2 g/dL (ref 30.0–36.0)
Monocytes Absolute: 0.8 10*3/uL (ref 0.1–1.0)
Monocytes Relative: 9 % (ref 3–12)

## 2012-04-30 LAB — URINE MICROSCOPIC-ADD ON

## 2012-04-30 LAB — LIPASE, BLOOD: Lipase: 23 U/L (ref 11–59)

## 2012-04-30 MED ORDER — ONDANSETRON HCL 4 MG/2ML IJ SOLN
4.0000 mg | Freq: Once | INTRAMUSCULAR | Status: AC
  Administered 2012-04-30: 4 mg via INTRAVENOUS
  Filled 2012-04-30: qty 2

## 2012-04-30 MED ORDER — KETOROLAC TROMETHAMINE 30 MG/ML IJ SOLN
30.0000 mg | Freq: Once | INTRAMUSCULAR | Status: AC
  Administered 2012-04-30: 30 mg via INTRAVENOUS
  Filled 2012-04-30: qty 1

## 2012-04-30 MED ORDER — POTASSIUM CHLORIDE CRYS ER 20 MEQ PO TBCR
40.0000 meq | EXTENDED_RELEASE_TABLET | Freq: Once | ORAL | Status: AC
  Administered 2012-04-30: 40 meq via ORAL
  Filled 2012-04-30: qty 2

## 2012-04-30 MED ORDER — MORPHINE SULFATE 4 MG/ML IJ SOLN: 4.0000 mg | Freq: Once | INTRAMUSCULAR | Status: DC

## 2012-04-30 MED ORDER — PROMETHAZINE HCL 25 MG PO TABS: 25.0000 mg | ORAL_TABLET | Freq: Four times a day (QID) | ORAL | Status: DC | PRN

## 2012-04-30 MED ORDER — POTASSIUM CHLORIDE 10 MEQ/100ML IV SOLN
10.0000 meq | Freq: Once | INTRAVENOUS | Status: AC
  Administered 2012-04-30: 10 meq via INTRAVENOUS
  Filled 2012-04-30: qty 100

## 2012-04-30 MED ORDER — SODIUM CHLORIDE 0.9 % IV BOLUS (SEPSIS)
1000.0000 mL | Freq: Once | INTRAVENOUS | Status: AC
  Administered 2012-04-30: 1000 mL via INTRAVENOUS

## 2012-04-30 NOTE — ED Notes (Signed)
Pt states that she has had abdominal pain since last Wednesday, March 12. She says she has been vomiting every day. Pt states that she can not keep any food down. Pt states she does not have an appetite. Pt pointed to the epigastric region of her abdomen. Pt states she has been experiencing a headache as well.

## 2012-04-30 NOTE — Progress Notes (Signed)
During [facility] ED 04/30/12 visit CM spoke with pt who confirms self pay [place] resident [place] no pcp in [place] visiting her mother. CM discussed and provided written information for self pay pcps, importance of pcp for f/u care, www.needymeds.org, discounted pharmacies, MATCH program and other [place] resources such as financial assistance, DSS and  health department Reviewed Health connect number to assist with finding self pay provider close to pt's residence. Reviewed resources for [place] self pay pcps like [facility], family medicine at [address], [facility] family practice, general medical clinics, [facility] urgent care plus others, [facility] out patient [facility]ies, housing, affordable care act/health reform (deadline 05/14/12) and other resources in [employer] Also provided [employer] to include [facility] Hours Wednesday 9:00AM - 12:00PM Thursday 1:00PM - 5:00PM  Office Hours Tuesday: 4:15 PM to 8:00 PM Thursday: 4:15 PM to 8:00 PM  Services Not Covered Dental Gynecology/Obstetrics Gastrointestinal/Liver Disease Orthopedics - We do not treat ANY type of pain   Participating Pharmacies Other Contact Info  [facility] [phone]) [facility] [phone]) [facility]. ([phone]) [facility] [phone])    Health Dept [phone] Mental Health [phone] Mental Health Crisis Line [phone] [facility] (PAP/Mammo) [phone] [facility] [phone] [place] [facility] ([email or web address]) [facility] [phone] [facility] [phone]   Pt voiced understanding and appreciation of resources provided

## 2012-04-30 NOTE — ED Provider Notes (Addendum)
History     CSN: [id number]  Arrival date & time 04/30/12  0809   First MD Initiated Contact with Patient 04/30/12 [phone]      Chief Complaint  Patient presents with  . Abdominal Pain  . Nausea    (Consider location/radiation/quality/duration/timing/severity/associated sxs/prior treatment) HPI Comments: Patient presents with 5 day history of n/v, reports inability to keep fluids or solids down.  She had diarrhea the first day, but not since.  All non-bloody.  Denies fevers or chills.  She had her gallbladder out in 2011 but no other prior surgeries.  LMP was last month and normal.  Denies abnormal bleeding.  Denies possibility of pregnancy.  Patient is a 27 y.o. female presenting with abdominal pain. The history is provided by the patient.  Abdominal Pain Pain location:  Epigastric Pain quality: cramping   Pain radiates to:  Does not radiate Pain severity:  Moderate Onset quality:  Gradual Duration:  5 days Timing:  Intermittent Chronicity:  New Relieved by:  Vomiting Worsened by:  Palpation Ineffective treatments:  None tried   History reviewed. No pertinent past medical history.  Past Surgical History  Procedure Laterality Date  . Cholecystectomy    . Cesarean section      History reviewed. No pertinent family history.  History  Substance Use Topics  . Smoking status: Current Some Day Smoker    Types: Cigars  . Smokeless tobacco: Not on file  . Alcohol Use: No    OB History   Grav Para Term Preterm Abortions TAB SAB Ect Mult Living                  Review of Systems  Gastrointestinal: Positive for abdominal pain.  All other systems reviewed and are negative.    Allergies  Review of patient's allergies indicates no known allergies.  Home Medications   Current Outpatient Rx  Name  Route  Sig  Dispense  Refill  . oxyCODONE-acetaminophen (PERCOCET/ROXICET) 5-325 MG per tablet   Oral   Take 2 tablets by mouth every 4 (four) hours as needed for  pain.   6 tablet   0   . promethazine (PHENERGAN) 25 MG tablet   Oral   Take 1 tablet (25 mg total) by mouth every 6 (six) hours as needed for nausea.   10 tablet   0     BP 136/88  Pulse 74  Temp(Src) 97.9 F (36.6 C) (Oral)  Resp 12  Ht 5\' 6"  (1.676 m)  Wt 300 lb (136.079 kg)  BMI 48.44 kg/m2  SpO2 99%  LMP 04/09/2012  Physical Exam  Nursing note and vitals reviewed. Constitutional: She is oriented to person, place, and time. She appears well-developed and well-nourished. No distress.  HENT:  Head: Normocephalic and atraumatic.  Neck: Normal range of motion. Neck supple.  Cardiovascular: Normal rate and regular rhythm.  Exam reveals no gallop and no friction rub.   No murmur heard. Pulmonary/Chest: Effort normal and breath sounds normal. No respiratory distress. She has no wheezes.  Abdominal: Soft. Bowel sounds are normal. She exhibits no distension. There is tenderness. There is no rebound and no guarding.  There is mild epigastric ttp.  No rebound or guarding.  Bowel sounds are present.  Musculoskeletal: Normal range of motion.  Neurological: She is alert and oriented to person, place, and time.  Skin: Skin is warm and dry. She is not diaphoretic.    ED Course  Procedures (including critical care time)  Labs Reviewed  CBC WITH DIFFERENTIAL  COMPREHENSIVE METABOLIC PANEL  LIPASE, BLOOD  URINALYSIS, ROUTINE W REFLEX MICROSCOPIC  PREGNANCY, URINE   No results found.   No diagnosis found.    MDM  The patient presents here with abd pain, n/v/d for the past several days.  She is status-post cholecystectomy and nothing in the labs tests indicates retained stone.  The urine is suggestive of a mild uti and the potassium is low and was replaced.  She appears well-hydrated and I believe is stable for discharge to home.  I suspect a viral etiology, will prescribe phenergan.  I see nothing that indicates further imaging, and a similar presentation several months ago  resulted in a normal ct.        Geoffery Lyons, MD 04/30/12 1142  Geoffery Lyons, MD 04/30/12 [phone]

## 2012-05-01 LAB — URINE CULTURE

## 2013-08-22 IMAGING — CR DG ABDOMEN ACUTE W/ 1V CHEST
4 series · 4 of 4 positions shown · non-contrast
Comparison: Chest and abdominal radiographs performed 02/03/2010,
and CT of the abdomen and pelvis performed 12/17/2011

CLINICAL DATA: Epigastric abdominal pain, nausea and vomiting.
History of smoking.  Diarrhea.

ACUTE ABDOMEN SERIES (ABDOMEN 2 VIEW & CHEST 1 VIEW)

[w chest pa]
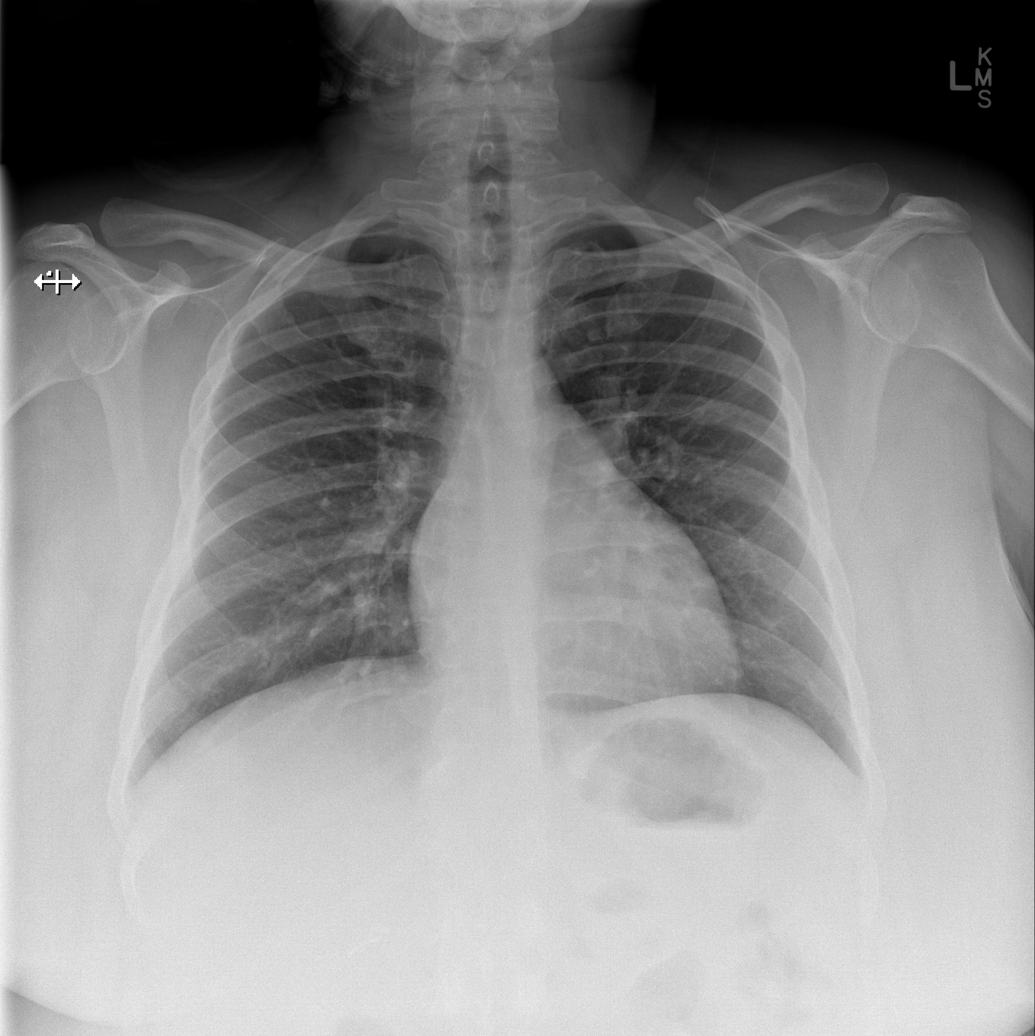

[w abdomen upright]
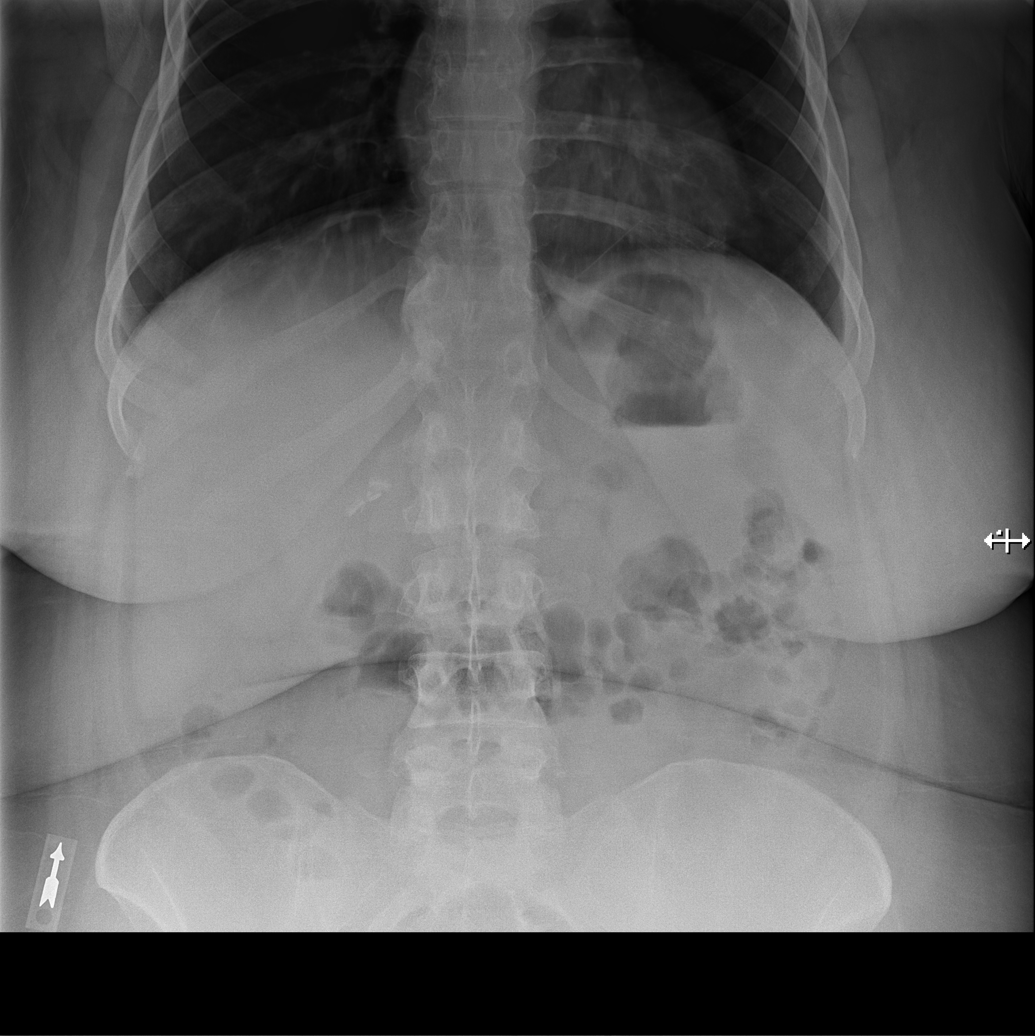

[t abdomen supine (1 of 2)]
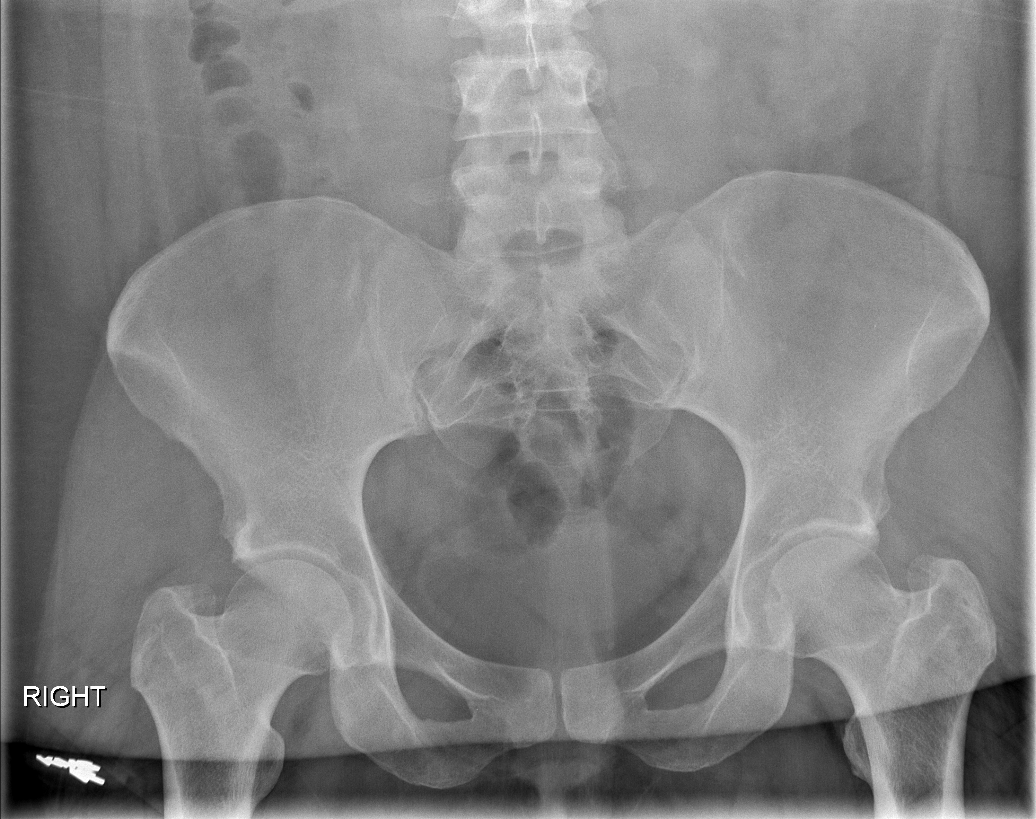

[t abdomen supine (2 of 2)]
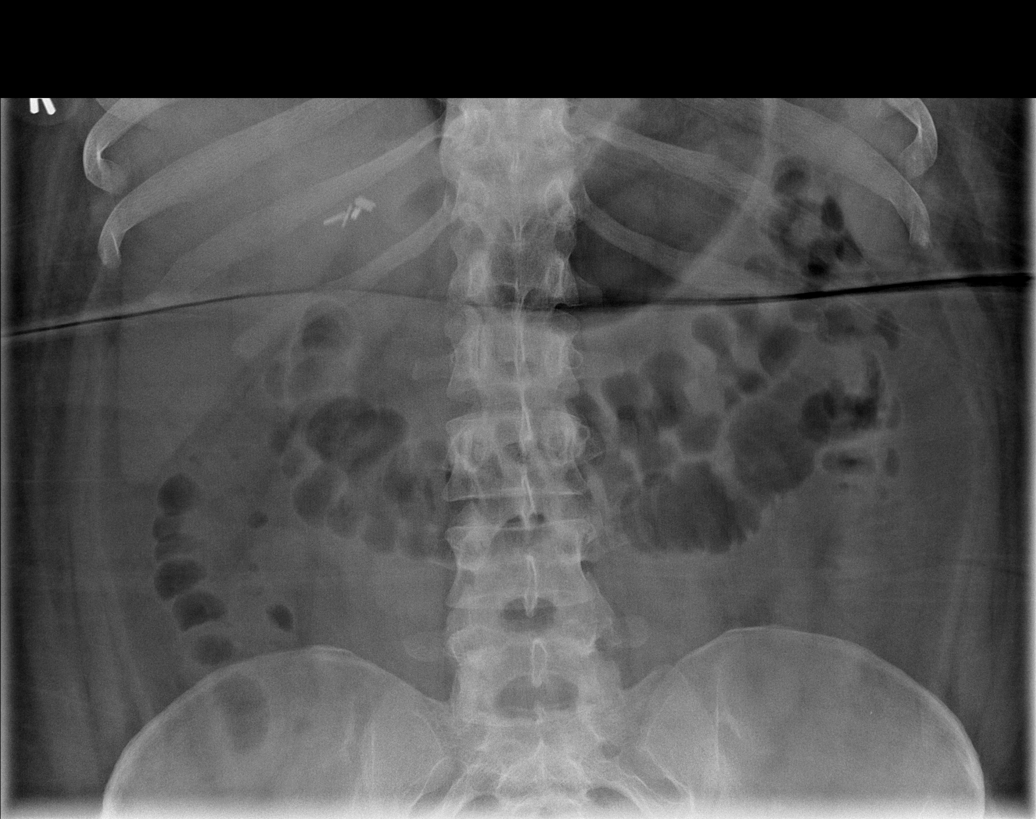

[4 of 4 positions shown; findings below may reference images not displayed]

FINDINGS: The lungs are well-aerated and clear.  There is no
evidence of focal opacification, pleural effusion or pneumothorax.
The cardiomediastinal silhouette is within normal limits.

The visualized bowel gas pattern is unremarkable.  Scattered stool
and air are seen within the colon; there is no evidence of small
bowel dilatation to suggest obstruction.  No free intra-abdominal
air is identified on the provided upright view.

No acute osseous abnormalities are seen; the sacroiliac joints are
unremarkable in appearance.  Clips are noted within the right upper
quadrant, reflecting prior cholecystectomy.
IMPRESSION: 1.  Unremarkable bowel gas pattern; no free intra-abdominal air
seen.
2.  No acute cardiopulmonary process identified.

## 2014-05-07 ENCOUNTER — Emergency Department (HOSPITAL_COMMUNITY): Payer: No Typology Code available for payment source

## 2014-05-07 ENCOUNTER — Emergency Department (HOSPITAL_COMMUNITY)
Admission: EM | Admit: 2014-05-07 | Discharge: 2014-05-08 | Disposition: A | Discharge status: Home / Self Care | Payer: No Typology Code available for payment source | Attending: Emergency Medicine | Admitting: Emergency Medicine

## 2014-05-07 ENCOUNTER — Encounter (HOSPITAL_COMMUNITY): Payer: Self-pay

## 2014-05-07 DIAGNOSIS — Y998 Other external cause status: Secondary | ICD-10-CM | POA: Insufficient documentation

## 2014-05-07 DIAGNOSIS — Z720 Tobacco use: Secondary | ICD-10-CM | POA: Diagnosis not present

## 2014-05-07 DIAGNOSIS — Z3202 Encounter for pregnancy test, result negative: Secondary | ICD-10-CM | POA: Insufficient documentation

## 2014-05-07 DIAGNOSIS — S32462A Displaced associated transverse-posterior fracture of left acetabulum, initial encounter for closed fracture: Secondary | ICD-10-CM | POA: Diagnosis not present

## 2014-05-07 DIAGNOSIS — S79912A Unspecified injury of left hip, initial encounter: Secondary | ICD-10-CM | POA: Diagnosis present

## 2014-05-07 DIAGNOSIS — Y9389 Activity, other specified: Secondary | ICD-10-CM | POA: Insufficient documentation

## 2014-05-07 DIAGNOSIS — Y92410 Unspecified street and highway as the place of occurrence of the external cause: Secondary | ICD-10-CM | POA: Diagnosis not present

## 2014-05-07 DIAGNOSIS — S32402A Unspecified fracture of left acetabulum, initial encounter for closed fracture: Secondary | ICD-10-CM

## 2014-05-07 DIAGNOSIS — R52 Pain, unspecified: Secondary | ICD-10-CM

## 2014-05-07 DIAGNOSIS — S0081XA Abrasion of other part of head, initial encounter: Secondary | ICD-10-CM

## 2014-05-07 LAB — PREGNANCY, URINE: Preg Test, Ur: NEGATIVE

## 2014-05-07 MED ORDER — HYDROCODONE-ACETAMINOPHEN 5-325 MG PO TABS
1.0000 | ORAL_TABLET | Freq: Once | ORAL | Status: AC
  Administered 2014-05-07: 1 via ORAL
  Filled 2014-05-07: qty 1

## 2014-05-07 MED ORDER — CYCLOBENZAPRINE HCL 10 MG PO TABS: 10.0000 mg | ORAL_TABLET | Freq: Three times a day (TID) | ORAL | Status: DC | PRN

## 2014-05-07 MED ORDER — IBUPROFEN 800 MG PO TABS
800.0000 mg | ORAL_TABLET | Freq: Once | ORAL | Status: AC
  Administered 2014-05-07: 800 mg via ORAL
  Filled 2014-05-07: qty 1

## 2014-05-07 MED ORDER — HYDROCODONE-ACETAMINOPHEN 5-325 MG PO TABS: 1.0000 | ORAL_TABLET | ORAL | Status: DC | PRN

## 2014-05-07 MED ORDER — IBUPROFEN 800 MG PO TABS: 800.0000 mg | ORAL_TABLET | Freq: Three times a day (TID) | ORAL | Status: DC | PRN

## 2014-05-07 NOTE — ED Notes (Signed)
Patient transported to CT 
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
## 2014-05-07 NOTE — ED Notes (Signed)
Called [facility] reports that they have 3 more pts to do before they can do pt's CT.  Melvenia BeamShari EDPA aware and pt.
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
## 2014-05-08 MED ORDER — OXYCODONE-ACETAMINOPHEN 5-325 MG PO TABS: 1.0000 | ORAL_TABLET | ORAL | Status: DC | PRN
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
## 2014-05-08 NOTE — ED Notes (Signed)
Crutches given to pt.  Pt able to show this nurse how to use crutches.

## 2015-11-15 ENCOUNTER — Encounter (HOSPITAL_COMMUNITY): Payer: Self-pay | Admitting: Emergency Medicine

## 2015-11-15 ENCOUNTER — Emergency Department (HOSPITAL_COMMUNITY)
Admission: EM | Admit: 2015-11-15 | Discharge: 2015-11-15 | Disposition: A | Discharge status: Home / Self Care | Payer: Medicaid Other | Attending: Emergency Medicine | Admitting: Emergency Medicine

## 2015-11-15 DIAGNOSIS — Z87891 Personal history of nicotine dependence: Secondary | ICD-10-CM | POA: Insufficient documentation

## 2015-11-15 DIAGNOSIS — F1410 Cocaine abuse, uncomplicated: Secondary | ICD-10-CM | POA: Insufficient documentation

## 2015-11-15 DIAGNOSIS — F1910 Other psychoactive substance abuse, uncomplicated: Secondary | ICD-10-CM

## 2015-11-15 HISTORY — DX: Other psychoactive substance abuse, uncomplicated: F19.10

## 2015-11-15 NOTE — ED Provider Notes (Signed)
[facility]-EMERGENCY DEPT Provider Note   CSN: [id number] Arrival date & time: 11/15/15  1425   History   Chief Complaint Chief Complaint  Patient presents with  . Detox    requesting treatment for cocaine abuse   HPI   Christina Cordova is an 30 y.o. female with history of polysubstance abuse who presents to the ED for detox assistance. She is accompanied by her mother. She states she has been using marijuana and crack cocaine intermittently since 2006. She states recently it has been daily use. Denies any other illicit drugs or alcohol. Last use yesterday. She states she is ready for change and has never gone to rehab before. She is unsure if she wants an inpatient or outpatient program. Denies suicidal or homicidal ideations. Denies chest pain, SOB, tremulousness, abd pain, n/v/d.  Past Medical History:  Diagnosis Date  . Substance abuse     There are no active problems to display for this patient.   Past Surgical History:  Procedure Laterality Date  . CESAREAN SECTION    . CHOLECYSTECTOMY      OB History    No data available       Home Medications    Prior to Admission medications   Medication Sig Start Date End Date Taking? Authorizing Provider  cyclobenzaprine (FLEXERIL) 10 MG tablet Take 1 tablet (10 mg total) by mouth 3 (three) times daily as needed for muscle spasms (or pain). Patient not taking: Reported on 11/15/2015 05/07/14   Trixie DredgeEmily West, PA-C  ibuprofen (ADVIL,MOTRIN) 800 MG tablet Take 1 tablet (800 mg total) by mouth every 8 (eight) hours as needed for mild pain or moderate pain. Patient not taking: Reported on 11/15/2015 05/07/14   Trixie DredgeEmily West, PA-C  oxyCODONE-acetaminophen (PERCOCET/ROXICET) 5-325 MG per tablet Take 1-2 tablets by mouth every 4 (four) hours as needed for severe pain. Patient not taking: Reported on 11/15/2015 05/08/14   Elpidio AnisShari Upstill, PA-C  promethazine (PHENERGAN) 25 MG tablet Take 1 tablet (25 mg total) by mouth every 6 (six) hours as needed for  nausea. Patient not taking: Reported on 11/15/2015 04/30/12   Geoffery Lyonsouglas Delo, MD    Family History Family History  Problem Relation Age of Onset  . Hypertension Mother   . Diabetes Mother   . Hypertension Father     Social History Social History  Substance Use Topics  . Smoking status: Former Smoker    Quit date: 10/16/2014  . Smokeless tobacco: Never Used  . Alcohol use No     Allergies   Review of patient's allergies indicates no known allergies.   Review of Systems Review of Systems 10 Systems reviewed and are negative for acute change except as noted in the HPI.  Physical Exam Updated Vital Signs BP (!) 152/102 (BP Location: Left Arm)   Pulse 72   Temp 97.7 F (36.5 C) (Oral)   Resp 18   Wt (!) 165.6 kg   LMP 10/16/2015 (Approximate)   SpO2 100%   BMI 58.91 kg/m   Physical Exam  Constitutional: She is oriented to person, place, and time. No distress.  Obese, NAD Sleeping but easily arousable  HENT:  Head: Atraumatic.  Right Ear: External ear normal.  Left Ear: External ear normal.  Nose: Nose normal.  Eyes: Conjunctivae are normal. No scleral icterus.  Cardiovascular: Normal rate and regular rhythm.   Pulmonary/Chest: Effort normal. No respiratory distress.  Abdominal: She exhibits no distension.  Neurological: She is alert and oriented to person, place, and time.  Skin: Skin is warm and dry. She is not diaphoretic.  Psychiatric: She has a normal mood and affect. Her behavior is normal.  Nursing note and vitals reviewed.    ED Treatments / Results  Labs (all labs ordered are listed, but only abnormal results are displayed) Labs Reviewed - No data to display  EKG  EKG Interpretation None       Radiology No results found.  Procedures Procedures (including critical care time)  Medications Ordered in ED Medications - No data to display   Initial Impression / Assessment and Plan / ED Course  I have reviewed the triage vital signs and the  nursing notes.  Pertinent labs & imaging results that were available during my care of the patient were reviewed by me and considered in my medical decision making (see chart for details).  Clinical Course   Pt presenting for detox assistance. No SI/HI. Admits to marijuana and crack cocaine use. Pt hemodynamically in NAD. Will give resource guides for substance abuse.  Final Clinical Impressions(s) / ED Diagnoses   Final diagnoses:  Polysubstance abuse    New Prescriptions New Prescriptions   No medications on file     Carlene Coria, PA-C 11/15/15 1603    Vanetta Mulders, MD 11/15/15 [phone]

## 2015-11-15 NOTE — ED Notes (Signed)
Bed: WTR5 Expected date:  Expected time:  Means of arrival:  Comments: 

## 2015-11-15 NOTE — Discharge Instructions (Signed)
See the attached reference guides for a list of facilities to help with your recovery process.

## 2015-11-15 NOTE — ED Triage Notes (Signed)
Pt is requesting treatment, detox, for substance abuse x 11 years.  Currently admits to cocaine and marijuana use. Denies alcohol use.Pt stated that she has never sought treatment before. Pt is alert,oriwented and cooperative. Ambulates without difficulty. Mother at bedside

## 2015-11-15 NOTE — ED Notes (Signed)
Verbalized understanding discharge instructions and resource guide. In no acute distress.

## 2016-07-01 ENCOUNTER — Emergency Department: Payer: Medicaid Other

## 2016-07-01 ENCOUNTER — Emergency Department
Admission: EM | Admit: 2016-07-01 | Discharge: 2016-07-02 | Disposition: A | Discharge status: Home / Self Care | Payer: Medicaid Other | Attending: Emergency Medicine | Admitting: Emergency Medicine

## 2016-07-01 DIAGNOSIS — Z79899 Other long term (current) drug therapy: Secondary | ICD-10-CM | POA: Diagnosis not present

## 2016-07-01 DIAGNOSIS — E876 Hypokalemia: Secondary | ICD-10-CM | POA: Insufficient documentation

## 2016-07-01 DIAGNOSIS — R1115 Cyclical vomiting syndrome unrelated to migraine: Secondary | ICD-10-CM

## 2016-07-01 DIAGNOSIS — R079 Chest pain, unspecified: Secondary | ICD-10-CM | POA: Diagnosis present

## 2016-07-01 DIAGNOSIS — Z87891 Personal history of nicotine dependence: Secondary | ICD-10-CM | POA: Insufficient documentation

## 2016-07-01 DIAGNOSIS — G43A0 Cyclical vomiting, not intractable: Secondary | ICD-10-CM | POA: Diagnosis not present

## 2016-07-01 LAB — COMPREHENSIVE METABOLIC PANEL
ALT: 19 U/L (ref 14–54)
ANION GAP: 10 (ref 5–15)
AST: 18 U/L (ref 15–41)
Albumin: 3.5 g/dL (ref 3.5–5.0)
Alkaline Phosphatase: 64 U/L (ref 38–126)
BUN: 10 mg/dL (ref 6–20)
CHLORIDE: 103 mmol/L (ref 101–111)
CO2: 24 mmol/L (ref 22–32)
CREATININE: 0.83 mg/dL (ref 0.44–1.00)
Calcium: 8.7 mg/dL — ABNORMAL LOW (ref 8.9–10.3)
Glucose, Bld: 112 mg/dL — ABNORMAL HIGH (ref 65–99)
POTASSIUM: 3 mmol/L — AB (ref 3.5–5.1)
Sodium: 137 mmol/L (ref 135–145)
Total Bilirubin: 0.5 mg/dL (ref 0.3–1.2)
Total Protein: 7.8 g/dL (ref 6.5–8.1)

## 2016-07-01 LAB — CBC
HCT: 41.3 % (ref 35.0–47.0)
Hemoglobin: 13.8 g/dL (ref 12.0–16.0)
MCH: 28 pg (ref 26.0–34.0)
MCHC: 33.4 g/dL (ref 32.0–36.0)
MCV: 84 fL (ref 80.0–100.0)
PLATELETS: 345 10*3/uL (ref 150–440)
RBC: 4.92 MIL/uL (ref 3.80–5.20)
RDW: 14.1 % (ref 11.5–14.5)
WBC: 12.1 10*3/uL — AB (ref 3.6–11.0)

## 2016-07-01 LAB — TROPONIN I

## 2016-07-01 LAB — LIPASE, BLOOD: LIPASE: 19 U/L (ref 11–51)

## 2016-07-01 MED ORDER — SODIUM CHLORIDE 0.9 % IV BOLUS (SEPSIS)
1000.0000 mL | INTRAVENOUS | Status: AC
  Administered 2016-07-01: 1000 mL via INTRAVENOUS

## 2016-07-01 MED ORDER — DIPHENHYDRAMINE HCL 50 MG/ML IJ SOLN
12.5000 mg | Freq: Once | INTRAMUSCULAR | Status: AC
  Administered 2016-07-01: 12.5 mg via INTRAVENOUS
  Filled 2016-07-01: qty 1

## 2016-07-01 MED ORDER — ONDANSETRON HCL 4 MG/2ML IJ SOLN
4.0000 mg | Freq: Once | INTRAMUSCULAR | Status: AC | PRN
  Administered 2016-07-01: 4 mg via INTRAVENOUS
  Filled 2016-07-01: qty 2

## 2016-07-01 MED ORDER — HALOPERIDOL LACTATE 5 MG/ML IJ SOLN
2.5000 mg | Freq: Once | INTRAMUSCULAR | Status: AC
  Administered 2016-07-01: 2.5 mg via INTRAVENOUS
  Filled 2016-07-01: qty 1

## 2016-07-01 NOTE — ED Triage Notes (Signed)
Patient c/o central chest pain, radiating to abdomen with associated symptoms of SOB, diaphoresis, N/V. Pt c/o N/V with two emeses today.

## 2016-07-01 NOTE — ED Notes (Signed)
Pt. Reports N/V/D this past week.  Pt. States others in household with similar symptoms.  Pt. States emesis twice today.

## 2016-07-01 NOTE — ED Provider Notes (Signed)
[facility] Emergency Department Provider Note  ____________________________________________   First MD Initiated Contact with Patient 07/01/16 2253     (approximate)  I have reviewed the triage vital signs and the nursing notes.   HISTORY  Chief Complaint Chest Pain and Emesis    HPI Christina Cordova is a 31 y.o. female with a history of morbid obesity and prior episodes of an explained nausea and vomitingas well as daily marijuana use who presents for evaluation of what she describes as about 4 days of persistent nausea and vomiting.  She initially told triage she was having abdominal pain and chest pain but she clarified with me that she is not having pain, her stomach "just feels upset".  She has been vomiting frequently and states she has not been able to eat any food for 3 days.  She has been able tolerate some liquids but not much.  She states that she has had 2 episodes of emesis today.  When she is actively vomiting she feels a bit short of breath but otherwise she denies shortness of breath, fever/chills, chest pain, abdominal pain, dysuria, diarrhea.  She describes her symptoms as severe and states that this has been happening multiple times for the last 6 years.  She states that she already has a GI doctor but she has not called him throughout this week of symptoms.  She has not had any recent medication changes.   Past Medical History:  Diagnosis Date  . Substance abuse     There are no active problems to display for this patient.   Past Surgical History:  Procedure Laterality Date  . CESAREAN SECTION    . CHOLECYSTECTOMY      Prior to Admission medications   Medication Sig Start Date End Date Taking? Authorizing Provider  cyclobenzaprine (FLEXERIL) 10 MG tablet Take 1 tablet (10 mg total) by mouth 3 (three) times daily as needed for muscle spasms (or pain). Patient not taking: Reported on 11/15/2015 05/07/14   Trixie DredgeWest, Emily, PA-C  ibuprofen  (ADVIL,MOTRIN) 800 MG tablet Take 1 tablet (800 mg total) by mouth every 8 (eight) hours as needed for mild pain or moderate pain. Patient not taking: Reported on 11/15/2015 05/07/14   Trixie DredgeWest, Emily, PA-C  ondansetron [facility](ZOFRAN) 4 MG tablet Take 1-2 tabs by mouth every 8 hours as needed for nausea/vomiting 07/02/16   Loleta RoseForbach, Javon Hupfer, MD  oxyCODONE-acetaminophen (PERCOCET/ROXICET) 5-325 MG per tablet Take 1-2 tablets by mouth every 4 (four) hours as needed for severe pain. Patient not taking: Reported on 11/15/2015 05/08/14   Elpidio AnisUpstill, Shari, PA-C  potassium chloride SA (KLOR-CON M20) 20 MEQ tablet Take 1 tablet (20 mEq total) by mouth daily. 07/02/16   Loleta RoseForbach, Lamond Glantz, MD  promethazine (PHENERGAN) 25 MG tablet Take 1 tablet (25 mg total) by mouth every 6 (six) hours as needed for nausea. Patient not taking: Reported on 11/15/2015 04/30/12   Geoffery Lyonselo, Douglas, MD    Allergies Patient has no known allergies.  Family History  Problem Relation Age of Onset  . Hypertension Mother   . Diabetes Mother   . Hypertension Father     Social History Social History  Substance Use Topics  . Smoking status: Former Smoker    Quit date: 10/16/2014  . Smokeless tobacco: Never Used  . Alcohol use No    Review of Systems Constitutional: No fever/chills Eyes: No visual changes. ENT: No sore throat. Cardiovascular: Denies chest pain. Respiratory: Denies shortness of breath. Gastrointestinal: Persistent nausea and vomiting  for about 4 days.  She denies abdominal pain Genitourinary: Negative for dysuria. Musculoskeletal: Negative for neck pain.  Negative for back pain. Integumentary: Negative for rash. Neurological: Negative for headaches, focal weakness or numbness.   ____________________________________________   PHYSICAL EXAM:  VITAL SIGNS: ED Triage Vitals  Enc Vitals Group     BP 07/01/16 2105 (!) 145/89     Pulse Rate 07/01/16 2105 (!) 56     Resp 07/01/16 2105 20     Temp 07/01/16 2105 99.2 F (37.3 C)       Temp Source 07/01/16 2105 Oral     SpO2 07/01/16 2105 97 %     Weight 07/01/16 2107 (!) 350 lb (158.8 kg)     Height 07/01/16 2107 5\' 6"  (1.676 m)     Head Circumference --      Peak Flow --      Pain Score 07/01/16 2105 9     Pain Loc --      Pain Edu? --      Excl. in GC? --     Constitutional: Alert and oriented. Well appearing and in no acute distress. Eyes: Conjunctivae are normal.  Head: Atraumatic. Nose: No congestion/rhinnorhea. Mouth/Throat: Mucous membranes are moist. Neck: No stridor.  No meningeal signs.   Cardiovascular: Normal rate, regular rhythm. Good peripheral circulation. Grossly normal heart sounds. Respiratory: Normal respiratory effort.  No retractions. Lungs CTAB. Gastrointestinal: Morbid obesity . Soft and nontender. No distention.  Musculoskeletal: No lower extremity tenderness nor edema. No gross deformities of extremities. Neurologic:  Normal speech and language. No gross focal neurologic deficits are appreciated.  Skin:  Skin is warm, dry and intact. No rash noted. Psychiatric: Mood and affect are normal. Speech and behavior are normal.  ____________________________________________   LABS (all labs ordered are listed, but only abnormal results are displayed)  Labs Reviewed  CBC - Abnormal; Notable for the following:       Result Value   WBC 12.1 (*)    All other components within normal limits  COMPREHENSIVE METABOLIC PANEL - Abnormal; Notable for the following:    Potassium 3.0 (*)    Glucose, Bld 112 (*)    Calcium 8.7 (*)    All other components within normal limits  TROPONIN I  LIPASE, BLOOD   ____________________________________________  EKG  ED ECG REPORT I, Isidro Monks, the attending physician, personally viewed and interpreted this ECG.  Date: 07/01/2016 EKG Time: 21:03 Rate: 63 Rhythm: normal sinus rhythm QRS Axis: normal Intervals: normal ST/T Wave abnormalities: Non-specific ST segment / T-wave changes, but no  evidence of acute ischemia.  Inverted T wave in lead V3 Conduction Disturbances: none Narrative Interpretation: unremarkable  ____________________________________________  RADIOLOGY   Dg Chest 2 View  Result Date: 07/01/2016 CLINICAL DATA:  Central chest pain radiating to the abdomen EXAM: CHEST  2 VIEW COMPARISON:  12/24/2011 FINDINGS: The heart size and mediastinal contours are within normal limits. Both lungs are clear. The visualized skeletal structures are unremarkable. Surgical clips in the right upper quadrant IMPRESSION: No active cardiopulmonary disease. Electronically Signed   By: Jasmine Pang M.D.   On: 07/01/2016 21:27    ____________________________________________   PROCEDURES  Critical Care performed: No   Procedure(s) performed:   Procedures   ____________________________________________   INITIAL IMPRESSION / ASSESSMENT AND PLAN / ED COURSE  Pertinent labs & imaging results that were available during my care of the patient were reviewed by me and considered in my medical decision making (see chart  for details).  In spite of the patient's HPI, she appears well-hydrated and her vital signs are stable.  Metabolic panel has not been ordered so I have added on to her existing labs but her lipase is normal and her troponin is negative and her CBC is notable only for a very mild leukocytosis.  I ask her about marijuana use and she stated that she smokes marijuana every day.  Her signs and symptoms are most consistent with either cyclic vomiting syndrome or cannabinoid hyperemesis syndrome.  I discussed both of these with the patient.  She is not in any pain right now, just suffering from the intractable vomiting.  She states that the Zofran she received earlier did not help.  I will treat her with Haldol 2.5 mg IV and Benadryl 12.5 mg IV, give her a liter of fluids, and subsequently will try to see if she can take some ginger ale and crackers.  I told her that I thought  that this would break the cycle of vomiting and she should be appropriate for outpatient follow-up and she is agreeable to the plan.  She is in no pain and has no abdominal tenderness and would not benefit from any narcotic or non-opioid pain medication at this time.   Clinical Course as of Jul 02 157  Sat Jul 02, 2016  1610 Patient feels much better and has been able to tolerate oral intake including potassium supplement.  I gave her information to read about cyclic vomiting syndrome and directed her to some online information about cannabinoid hyperemesis syndrome.    I gave my usual and customary return precautions.     [CF]    Clinical Course User Index [CF] Loleta Rose, MD    ____________________________________________  FINAL CLINICAL IMPRESSION(S) / ED DIAGNOSES  Final diagnoses:  Non-intractable cyclical vomiting with nausea  Hypokalemia, gastrointestinal losses     MEDICATIONS GIVEN DURING THIS VISIT:  Medications  ondansetron (ZOFRAN) injection 4 mg (4 mg Intravenous Given 07/01/16 2202)  haloperidol lactate (HALDOL) injection 2.5 mg (2.5 mg Intravenous Given 07/01/16 2325)  diphenhydrAMINE (BENADRYL) injection 12.5 mg (12.5 mg Intravenous Given 07/01/16 2326)  sodium chloride 0.9 % bolus 1,000 mL (0 mLs Intravenous Stopped 07/02/16 0104)  potassium chloride (KLOR-CON) packet 40 mEq (40 mEq Oral Given 07/02/16 0018)     NEW OUTPATIENT MEDICATIONS STARTED DURING THIS VISIT:  Discharge Medication List as of 07/02/2016 12:51 AM    START taking these medications   Details  ondansetron (ZOFRAN) 4 MG tablet Take 1-2 tabs by mouth every 8 hours as needed for nausea/vomiting, Print    potassium chloride SA (KLOR-CON M20) 20 MEQ tablet Take 1 tablet (20 mEq total) by mouth daily., Starting Sat 07/02/2016, Print        Discharge Medication List as of 07/02/2016 12:51 AM      Discharge Medication List as of 07/02/2016 12:51 AM       Note:  This document was prepared  using Dragon voice recognition software and may include unintentional dictation errors.    Loleta Rose, MD 07/02/16 [phone]

## 2016-07-02 MED ORDER — POTASSIUM CHLORIDE 20 MEQ PO PACK
40.0000 meq | PACK | ORAL | Status: AC
  Administered 2016-07-02: 40 meq via ORAL
  Filled 2016-07-02: qty 2

## 2016-07-02 MED ORDER — POTASSIUM CHLORIDE CRYS ER 20 MEQ PO TBCR: 20.0000 meq | EXTENDED_RELEASE_TABLET | Freq: Every day | ORAL | 0 refills | Status: DC

## 2016-07-02 MED ORDER — ONDANSETRON HCL 4 MG PO TABS: ORAL_TABLET | ORAL | 0 refills | Status: DC

## 2016-07-02 NOTE — Discharge Instructions (Signed)
As we discussed, I believe you are suffering from either cyclic vomiting syndrome or cannabinoid hyperemesis syndrome.  I do not have any information or references about cannabinoid hyperemesis syndrome, but you can look it up online, even on Wikipedia, and I believe you will see that it may apply to you.  We recommend you stop smoking any marijuana from now on because only long-term cessation can help make the symptoms go away.  In the meantime try to stick to a bland diet and use the prescribed nausea medicine as needed.  Follow up with your doctor or your GI specialist at the next available opportunity.

## 2016-07-02 NOTE — ED Notes (Signed)
Pt. Going home with friend 

## 2016-07-02 NOTE — ED Notes (Signed)
Pt. Given saltine crackers and ginger ale, for PO challenge.

## 2016-09-11 ENCOUNTER — Encounter (HOSPITAL_COMMUNITY): Payer: Self-pay | Admitting: Emergency Medicine

## 2016-09-11 ENCOUNTER — Emergency Department (HOSPITAL_COMMUNITY)
Admission: EM | Admit: 2016-09-11 | Discharge: 2016-09-11 | Disposition: A | Discharge status: Home / Self Care | Payer: Medicaid Other | Attending: Emergency Medicine | Admitting: Emergency Medicine

## 2016-09-11 DIAGNOSIS — R001 Bradycardia, unspecified: Secondary | ICD-10-CM

## 2016-09-11 DIAGNOSIS — Z79899 Other long term (current) drug therapy: Secondary | ICD-10-CM | POA: Diagnosis not present

## 2016-09-11 DIAGNOSIS — E876 Hypokalemia: Secondary | ICD-10-CM | POA: Insufficient documentation

## 2016-09-11 DIAGNOSIS — Z87891 Personal history of nicotine dependence: Secondary | ICD-10-CM | POA: Diagnosis not present

## 2016-09-11 DIAGNOSIS — R112 Nausea with vomiting, unspecified: Secondary | ICD-10-CM | POA: Insufficient documentation

## 2016-09-11 LAB — COMPREHENSIVE METABOLIC PANEL
ALT: 20 U/L (ref 14–54)
ANION GAP: 10 (ref 5–15)
AST: 20 U/L (ref 15–41)
Albumin: 3.3 g/dL — ABNORMAL LOW (ref 3.5–5.0)
Alkaline Phosphatase: 59 U/L (ref 38–126)
BILIRUBIN TOTAL: 0.5 mg/dL (ref 0.3–1.2)
BUN: 8 mg/dL (ref 6–20)
CO2: 26 mmol/L (ref 22–32)
Calcium: 9 mg/dL (ref 8.9–10.3)
Chloride: 105 mmol/L (ref 101–111)
Creatinine, Ser: 0.72 mg/dL (ref 0.44–1.00)
GFR calc Af Amer: >60 mL/min (ref >60)
Glucose, Bld: 118 mg/dL — ABNORMAL HIGH (ref 65–99)
POTASSIUM: 3 mmol/L — AB (ref 3.5–5.1)
Sodium: 141 mmol/L (ref 135–145)
TOTAL PROTEIN: 7.7 g/dL (ref 6.5–8.1)

## 2016-09-11 LAB — URINALYSIS, ROUTINE W REFLEX MICROSCOPIC
Bilirubin Urine: NEGATIVE
GLUCOSE, UA: NEGATIVE mg/dL
HGB URINE DIPSTICK: NEGATIVE
KETONES UR: 5 mg/dL — AB
LEUKOCYTES UA: NEGATIVE
NITRITE: NEGATIVE
PROTEIN: 100 mg/dL — AB
RBC / HPF: NONE SEEN RBC/hpf (ref 0–5)
Specific Gravity, Urine: 1.03 (ref 1.005–1.030)
WBC, UA: NONE SEEN WBC/hpf (ref 0–5)
pH: 5 (ref 5.0–8.0)

## 2016-09-11 LAB — LIPASE, BLOOD: Lipase: 21 U/L (ref 11–51)

## 2016-09-11 LAB — I-STAT BETA HCG BLOOD, ED (MC, WL, AP ONLY): I-stat hCG, quantitative: <5 m[IU]/mL (ref <5)

## 2016-09-11 LAB — CBC
HEMATOCRIT: 39.9 % (ref 36.0–46.0)
HEMOGLOBIN: 13.2 g/dL (ref 12.0–15.0)
MCH: 28.3 pg (ref 26.0–34.0)
MCHC: 33.1 g/dL (ref 30.0–36.0)
MCV: 85.4 fL (ref 78.0–100.0)
Platelets: 308 10*3/uL (ref 150–400)
RBC: 4.67 MIL/uL (ref 3.87–5.11)
RDW: 14.6 % (ref 11.5–15.5)
WBC: 11.6 10*3/uL — ABNORMAL HIGH (ref 4.0–10.5)

## 2016-09-11 LAB — MAGNESIUM: Magnesium: 1.8 mg/dL (ref 1.7–2.4)

## 2016-09-11 MED ORDER — ONDANSETRON HCL 4 MG PO TABS: 4.0000 mg | ORAL_TABLET | Freq: Three times a day (TID) | ORAL | 0 refills | Status: DC | PRN

## 2016-09-11 MED ORDER — HALOPERIDOL LACTATE 5 MG/ML IJ SOLN
2.0000 mg | Freq: Once | INTRAMUSCULAR | Status: AC
  Administered 2016-09-11: 2 mg via INTRAVENOUS
  Filled 2016-09-11: qty 1

## 2016-09-11 MED ORDER — POTASSIUM CHLORIDE 10 MEQ/100ML IV SOLN
10.0000 meq | Freq: Once | INTRAVENOUS | Status: AC
  Administered 2016-09-11: 10 meq via INTRAVENOUS
  Filled 2016-09-11: qty 100

## 2016-09-11 MED ORDER — ONDANSETRON 4 MG PO TBDP
ORAL_TABLET | ORAL | Status: AC
  Filled 2016-09-11: qty 1

## 2016-09-11 MED ORDER — ONDANSETRON 4 MG PO TBDP
4.0000 mg | ORAL_TABLET | Freq: Once | ORAL | Status: AC | PRN
  Administered 2016-09-11: 4 mg via ORAL

## 2016-09-11 MED ORDER — SODIUM CHLORIDE 0.9 % IV BOLUS (SEPSIS)
1000.0000 mL | Freq: Once | INTRAVENOUS | Status: AC
  Administered 2016-09-11: 1000 mL via INTRAVENOUS

## 2016-09-11 NOTE — Discharge Instructions (Signed)
Please return to the emergency department if you're unable to stop vomiting. Follow-up with primary care doctor regarding your low heart rate. Return for any symptoms of loss of consciousness or chest pain.

## 2016-09-11 NOTE — ED Triage Notes (Signed)
Pt reports emesis since Friday. Denies abdominal pain but reports cramping with vomiting. Pt works in assisted living. Afebrile.

## 2016-09-11 NOTE — ED Provider Notes (Signed)
[facility]-EMERGENCY DEPT Provider Note   CSN: [id number] Arrival date & time: 09/11/16  0940     History   Chief Complaint Chief Complaint  Patient presents with  . Emesis    HPI Christina Cordova is a 31 y.o. female who presents emergency Department with chief complaint of nausea and vomiting. She has had previous episodes of self-limited vomiting for many years. She states it has become more frequent this year. She admits to daily marijuana use for many years. She states that in the past, hot showers haven't been helpful. However, this time it has not. She had onset of epigastric pain and vomiting 2 days ago. She complains of epigastric abdominal pain. She denies weakness or presyncope. She denies diarrhea, fevers or chills. She denies alcohol abuse or NSAID use.  HPI  Past Medical History:  Diagnosis Date  . Substance abuse     There are no active problems to display for this patient.   Past Surgical History:  Procedure Laterality Date  . CESAREAN SECTION    . CHOLECYSTECTOMY      OB History    No data available       Home Medications    Prior to Admission medications   Medication Sig Start Date End Date Taking? Authorizing Provider  ondansetron (ZOFRAN) 4 MG tablet Take 1-2 tabs by mouth every 8 hours as needed for nausea/vomiting 07/02/16  Yes Loleta RoseForbach, Cory, MD  potassium chloride SA (KLOR-CON M20) 20 MEQ tablet Take 1 tablet (20 mEq total) by mouth daily. 07/02/16  Yes Loleta RoseForbach, Cory, MD  cyclobenzaprine (FLEXERIL) 10 MG tablet Take 1 tablet (10 mg total) by mouth 3 (three) times daily as needed for muscle spasms (or pain). Patient not taking: Reported on 11/15/2015 05/07/14   Trixie DredgeWest, Emily, PA-C  ibuprofen (ADVIL,MOTRIN) 800 MG tablet Take 1 tablet (800 mg total) by mouth every 8 (eight) hours as needed for mild pain or moderate pain. Patient not taking: Reported on 11/15/2015 05/07/14   Trixie DredgeWest, Emily, PA-C  oxyCODONE-acetaminophen (PERCOCET/ROXICET) 5-325 MG per tablet Take  1-2 tablets by mouth every 4 (four) hours as needed for severe pain. Patient not taking: Reported on 11/15/2015 05/08/14   Elpidio AnisUpstill, Shari, PA-C  promethazine (PHENERGAN) 25 MG tablet Take 1 tablet (25 mg total) by mouth every 6 (six) hours as needed for nausea. Patient not taking: Reported on 11/15/2015 04/30/12   Geoffery Lyonselo, Douglas, MD    Family History Family History  Problem Relation Age of Onset  . Hypertension Mother   . Diabetes Mother   . Hypertension Father     Social History Social History  Substance Use Topics  . Smoking status: Former Smoker    Quit date: 10/16/2014  . Smokeless tobacco: Never Used  . Alcohol use No     Allergies   Patient has no known allergies.   Review of Systems Review of Systems Ten systems reviewed and are negative for acute change, except as noted in the HPI.    Physical Exam Updated Vital Signs BP (!) 153/77   Pulse (!) 51   Temp 98 F (36.7 C) (Oral)   Resp (!) 22   Ht 5\' 6"  (1.676 m)   Wt (!) 158.8 kg (350 lb)   SpO2 100%   BMI 56.49 kg/m   Physical Exam  Constitutional: She is oriented to person, place, and time. She appears well-developed and well-nourished. No distress.  Appears uncomfortable  HENT:  Head: Normocephalic and atraumatic.  Eyes: Conjunctivae are normal. No scleral  icterus.  Neck: Normal range of motion.  Cardiovascular: Regular rhythm and normal heart sounds.  Bradycardia present.  Exam reveals no gallop and no friction rub.   No murmur heard. Pulmonary/Chest: Effort normal and breath sounds normal. No respiratory distress.  Abdominal: Soft. Bowel sounds are normal. She exhibits no distension and no mass. There is no tenderness. There is no guarding.  Neurological: She is alert and oriented to person, place, and time.  Skin: Skin is warm and dry. She is not diaphoretic.  Psychiatric: Her behavior is normal.  Nursing note and vitals reviewed.    ED Treatments / Results  Labs (all labs ordered are listed, but  only abnormal results are displayed) Labs Reviewed  COMPREHENSIVE METABOLIC PANEL - Abnormal; Notable for the following:       Result Value   Potassium 3.0 (*)    Glucose, Bld 118 (*)    Albumin 3.3 (*)    All other components within normal limits  CBC - Abnormal; Notable for the following:    WBC 11.6 (*)    All other components within normal limits  URINALYSIS, ROUTINE W REFLEX MICROSCOPIC - Abnormal; Notable for the following:    Color, Urine AMBER (*)    APPearance TURBID (*)    Ketones, ur 5 (*)    Protein, ur 100 (*)    Bacteria, UA RARE (*)    Squamous Epithelial / LPF 6-30 (*)    All other components within normal limits  LIPASE, BLOOD  MAGNESIUM  I-STAT BETA HCG BLOOD, ED ([facility], [facility], [facility] ONLY)    EKG  EKG Interpretation  Date/Time:  Sunday September 11 2016 12:13:30 EDT Ventricular Rate:  48 PR Interval:    QRS Duration: 111 QT Interval:  460 QTC Calculation: 411 R Axis:   65 Text Interpretation:  Sinus bradycardia Borderline T abnormalities, anterior leads Since last tracing rate slower Otherwise no significant change Confirmed by Melene PlanFloyd, Dan [phone]([id number]) on 09/11/2016 2:11:24 PM       Radiology No results found.  Procedures Procedures (including critical care time)  Medications Ordered in ED Medications  ondansetron (ZOFRAN-ODT) disintegrating tablet 4 mg (4 mg Oral Given 09/11/16 0958)  sodium chloride 0.9 % bolus 1,000 mL (0 mLs Intravenous Stopped 09/11/16 1346)  potassium chloride 10 mEq in 100 mL IVPB (0 mEq Intravenous Stopped 09/11/16 1332)  haloperidol lactate (HALDOL) injection 2 mg (2 mg Intravenous Given 09/11/16 1349)     Initial Impression / Assessment and Plan / ED Course  I have reviewed the triage vital signs and the nursing notes.  Pertinent labs & imaging results that were available during my care of the patient were reviewed by me and considered in my medical decision making (see chart for details).     Patient with nausea and vomiting. Likely  cannabis hyperemesis. Discussed the discontinuing use of marijuana. Patient bradycardic, but asymptomatic. Do not see this as a previous diagnosis. She is no QT prolongation. Mild hypokalemia, repleted in the ED. We'll discharge with a single dose. Patient feeling improved and holding down fluids. She denies pain or nausea at this time. Given 2 mg IV Haldol. She appears safe for discharge at this time. Discussed return precautions. Follow up with PCP  Final Clinical Impressions(s) / ED Diagnoses   Final diagnoses:  None    New Prescriptions New Prescriptions   No medications on file     Arthor CaptainHarris, Tesean Stump, PA-C 09/11/16 1446    Melene PlanFloyd, Dan, DO 09/11/16 1456

## 2016-09-11 NOTE — ED Notes (Signed)
Pt. Able to keep down ginger ale

## 2017-11-28 ENCOUNTER — Other Ambulatory Visit: Payer: Self-pay

## 2017-11-28 ENCOUNTER — Emergency Department: Payer: No Typology Code available for payment source

## 2017-11-28 ENCOUNTER — Encounter: Payer: Self-pay | Admitting: Emergency Medicine

## 2017-11-28 ENCOUNTER — Emergency Department
Admission: EM | Admit: 2017-11-28 | Discharge: 2017-11-28 | Disposition: A | Discharge status: Home / Self Care | Payer: No Typology Code available for payment source | Attending: Emergency Medicine | Admitting: Emergency Medicine

## 2017-11-28 DIAGNOSIS — Y92410 Unspecified street and highway as the place of occurrence of the external cause: Secondary | ICD-10-CM | POA: Insufficient documentation

## 2017-11-28 DIAGNOSIS — Y999 Unspecified external cause status: Secondary | ICD-10-CM | POA: Insufficient documentation

## 2017-11-28 DIAGNOSIS — Z87891 Personal history of nicotine dependence: Secondary | ICD-10-CM | POA: Insufficient documentation

## 2017-11-28 DIAGNOSIS — G44301 Post-traumatic headache, unspecified, intractable: Secondary | ICD-10-CM | POA: Diagnosis not present

## 2017-11-28 DIAGNOSIS — S161XXA Strain of muscle, fascia and tendon at neck level, initial encounter: Secondary | ICD-10-CM | POA: Diagnosis not present

## 2017-11-28 DIAGNOSIS — Y9389 Activity, other specified: Secondary | ICD-10-CM | POA: Insufficient documentation

## 2017-11-28 DIAGNOSIS — Z79899 Other long term (current) drug therapy: Secondary | ICD-10-CM | POA: Diagnosis not present

## 2017-11-28 DIAGNOSIS — S199XXA Unspecified injury of neck, initial encounter: Secondary | ICD-10-CM | POA: Diagnosis present

## 2017-11-28 LAB — POCT PREGNANCY, URINE: Preg Test, Ur: NEGATIVE

## 2017-11-28 MED ORDER — MELOXICAM 15 MG PO TABS: 15.0000 mg | ORAL_TABLET | Freq: Every day | ORAL | 2 refills | Status: AC

## 2017-11-28 MED ORDER — BACLOFEN 10 MG PO TABS: 10.0000 mg | ORAL_TABLET | Freq: Three times a day (TID) | ORAL | 1 refills | Status: AC

## 2017-11-28 NOTE — ED Notes (Signed)
Patient transported to CT 
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
## 2017-11-28 NOTE — Discharge Instructions (Addendum)
Follow-up with your regular doctor if not better in 5 7 days.  Return emergency department if worsening.  Take the medications as prescribed.

## 2018-11-09 ENCOUNTER — Ambulatory Visit: Payer: Self-pay

## 2019-04-21 ENCOUNTER — Encounter: Payer: Self-pay | Admitting: Emergency Medicine

## 2019-04-21 ENCOUNTER — Other Ambulatory Visit: Payer: Self-pay

## 2019-04-21 ENCOUNTER — Emergency Department
Admission: EM | Admit: 2019-04-21 | Discharge: 2019-04-21 | Disposition: A | Discharge status: Home / Self Care | Payer: Self-pay | Attending: Emergency Medicine | Admitting: Emergency Medicine

## 2019-04-21 DIAGNOSIS — R197 Diarrhea, unspecified: Secondary | ICD-10-CM | POA: Insufficient documentation

## 2019-04-21 DIAGNOSIS — R1030 Lower abdominal pain, unspecified: Secondary | ICD-10-CM | POA: Insufficient documentation

## 2019-04-21 DIAGNOSIS — R112 Nausea with vomiting, unspecified: Secondary | ICD-10-CM | POA: Insufficient documentation

## 2019-04-21 DIAGNOSIS — Z20822 Contact with and (suspected) exposure to covid-19: Secondary | ICD-10-CM | POA: Insufficient documentation

## 2019-04-21 LAB — CBC
HCT: 39 % (ref 36.0–46.0)
Hemoglobin: 12.7 g/dL (ref 12.0–15.0)
MCH: 28.8 pg (ref 26.0–34.0)
MCHC: 32.6 g/dL (ref 30.0–36.0)
MCV: 88.4 fL (ref 80.0–100.0)
Platelets: 338 10*3/uL (ref 150–400)
RBC: 4.41 MIL/uL (ref 3.87–5.11)
RDW: 14.4 % (ref 11.5–15.5)
WBC: 11.3 10*3/uL — ABNORMAL HIGH (ref 4.0–10.5)
nRBC: 0 % (ref 0.0–0.2)

## 2019-04-21 LAB — URINALYSIS, COMPLETE (UACMP) WITH MICROSCOPIC
Bilirubin Urine: NEGATIVE
Glucose, UA: NEGATIVE mg/dL
Hgb urine dipstick: NEGATIVE
Ketones, ur: NEGATIVE mg/dL
Nitrite: NEGATIVE
Protein, ur: 30 mg/dL — AB
Specific Gravity, Urine: 1.028 (ref 1.005–1.030)
pH: 5 (ref 5.0–8.0)

## 2019-04-21 LAB — COMPREHENSIVE METABOLIC PANEL
ALT: 27 U/L (ref 0–44)
AST: 24 U/L (ref 15–41)
Albumin: 3.8 g/dL (ref 3.5–5.0)
Alkaline Phosphatase: 65 U/L (ref 38–126)
Anion gap: 9 (ref 5–15)
BUN: 9 mg/dL (ref 6–20)
CO2: 26 mmol/L (ref 22–32)
Calcium: 8.9 mg/dL (ref 8.9–10.3)
Chloride: 104 mmol/L (ref 98–111)
Creatinine, Ser: 0.66 mg/dL (ref 0.44–1.00)
GFR calc Af Amer: >60 mL/min (ref >60)
GFR calc non Af Amer: >60 mL/min (ref >60)
Glucose, Bld: 117 mg/dL — ABNORMAL HIGH (ref 70–99)
Potassium: 3 mmol/L — ABNORMAL LOW (ref 3.5–5.1)
Sodium: 139 mmol/L (ref 135–145)
Total Bilirubin: 0.5 mg/dL (ref 0.3–1.2)
Total Protein: 8.4 g/dL — ABNORMAL HIGH (ref 6.5–8.1)

## 2019-04-21 LAB — LIPASE, BLOOD: Lipase: 36 U/L (ref 11–51)

## 2019-04-21 LAB — PREGNANCY, URINE: Preg Test, Ur: NEGATIVE

## 2019-04-21 MED ORDER — PROMETHAZINE HCL 25 MG/ML IJ SOLN
25.0000 mg | Freq: Once | INTRAMUSCULAR | Status: AC
  Administered 2019-04-21: 25 mg via INTRAVENOUS
  Filled 2019-04-21: qty 1

## 2019-04-21 MED ORDER — ONDANSETRON HCL 4 MG/2ML IJ SOLN
4.0000 mg | Freq: Once | INTRAMUSCULAR | Status: AC
  Administered 2019-04-21: 4 mg via INTRAVENOUS
  Filled 2019-04-21: qty 2

## 2019-04-21 MED ORDER — PROMETHAZINE HCL 12.5 MG PO TABS: 12.5000 mg | ORAL_TABLET | Freq: Four times a day (QID) | ORAL | 0 refills | Status: AC | PRN

## 2019-04-21 MED ORDER — SODIUM CHLORIDE 0.9 % IV BOLUS
1000.0000 mL | Freq: Once | INTRAVENOUS | Status: AC
  Administered 2019-04-21: 1000 mL via INTRAVENOUS

## 2019-04-21 NOTE — Discharge Instructions (Signed)
Please call and schedule follow-up appointment with your primary care provider if your symptoms are not improving over the next 2 to 3 days.  You will need to stay home until the COVID-19 test has resulted.  If negative, you may return to work once your symptoms have resolved.  If positive, you will need to remain home 10 days after the onset of symptoms.  Return to the emergency department for symptoms that change or worsen if you are unable to schedule an appointment.

## 2019-04-21 NOTE — ED Provider Notes (Signed)
[facility] Emergency Department Provider Note ____________________________________________   First MD Initiated Contact with Patient 04/21/19 1511     (approximate)  I have reviewed the triage vital signs and the nursing notes.   HISTORY  Chief Complaint Abdominal Pain and Emesis  HPI Christina Cordova is a 34 y.o. female presents emergency department for treatment and evaluation of lower abdominal pain with nausea, vomiting, and diarrhea for the past 3 days. No known exposure to influenza or COVID-19. No alleviating measures attempted prior to arrival. She denies fever. She denies dysuria or urinary symptoms.      Past Medical History:  Diagnosis Date  . Substance abuse (HCC)     There are no problems to display for this patient.   Past Surgical History:  Procedure Laterality Date  . CESAREAN SECTION    . CHOLECYSTECTOMY      Prior to Admission medications   Medication Sig Start Date End Date Taking? Authorizing Provider  promethazine (PHENERGAN) 12.5 MG tablet Take 1 tablet (12.5 mg total) by mouth every 6 (six) hours as needed for nausea or vomiting. 04/21/19   Chinita Pester, FNP    Allergies Patient has no known allergies.  Family History  Problem Relation Age of Onset  . Hypertension Mother   . Diabetes Mother   . Hypertension Father     Social History Social History   Tobacco Use  . Smoking status: Former Smoker    Quit date: 10/16/2014    Years since quitting: 4.5  . Smokeless tobacco: Never Used  Substance Use Topics  . Alcohol use: No  . Drug use: Yes    Frequency: 3.0 times per week    Types: Cocaine, Marijuana    Review of Systems  Constitutional: No fever/chills Eyes: No visual changes. ENT: No sore throat. Cardiovascular: Denies chest pain. Respiratory: Denies shortness of breath. Gastrointestinal: Positive for abdominal pain. Positive for nausea, vomiting, diarrhea genitourinary: Negative for dysuria.  Musculoskeletal: Negative for back pain. Skin: Negative for rash. Neurological: Negative for headaches, focal weakness or numbness. ____________________________________________   PHYSICAL EXAM:  VITAL SIGNS: ED Triage Vitals [04/21/19 1153]  Enc Vitals Group     BP (!) 163/94     Pulse Rate (!) 58     Resp 18     Temp 98.5 F (36.9 C)     Temp Source Oral     SpO2 96 %     Weight 300 lb (136.1 kg)     Height 5\' 6"  (1.676 m)     Head Circumference      Peak Flow      Pain Score 9     Pain Loc      Pain Edu?      Excl. in GC?     Constitutional: Alert and oriented. Well appearing and in no acute distress. Eyes: Conjunctivae are normal.  Head: Atraumatic. Nose: No congestion/rhinnorhea. Mouth/Throat: Mucous membranes are moist.  Oropharynx non-erythematous. Neck: No stridor.   Hematological/Lymphatic/Immunilogical: No cervical lymphadenopathy. Cardiovascular: Normal rate, regular rhythm. Grossly normal heart sounds.  Good peripheral circulation. Respiratory: Normal respiratory effort.  No retractions. Lungs CTAB. Gastrointestinal: Soft and nontender. No rebound or guarding.. No distention. No abdominal bruits. No CVA tenderness. Genitourinary:  Musculoskeletal: No lower extremity tenderness nor edema.  No joint effusions. Neurologic:  Normal speech and language. No gross focal neurologic deficits are appreciated. No gait instability. Skin:  Skin is warm, dry and intact. No rash noted. Psychiatric: Mood and affect  are normal. Speech and behavior are normal.  ____________________________________________   LABS (all labs ordered are listed, but only abnormal results are displayed)  Labs Reviewed  COMPREHENSIVE METABOLIC PANEL - Abnormal; Notable for the following components:      Result Value   Potassium 3.0 (*)    Glucose, Bld 117 (*)    Total Protein 8.4 (*)    All other components within normal limits  CBC - Abnormal; Notable for the following components:   WBC  11.3 (*)    All other components within normal limits  URINALYSIS, COMPLETE (UACMP) WITH MICROSCOPIC - Abnormal; Notable for the following components:   Color, Urine AMBER (*)    APPearance HAZY (*)    Protein, ur 30 (*)    Leukocytes,Ua TRACE (*)    Bacteria, UA RARE (*)    All other components within normal limits  SARS CORONAVIRUS 2 (TAT 6-24 HRS)  LIPASE, BLOOD  PREGNANCY, URINE   ____________________________________________  EKG  Not indicated ____________________________________________  RADIOLOGY  ED MD interpretation:    Not indicated  Official radiology report(s): No results found.  ____________________________________________   PROCEDURES  Procedure(s) performed (including Critical Care):  Procedures  ____________________________________________   INITIAL IMPRESSION / ASSESSMENT AND PLAN     34 year old female presenting to the emergency department for a few days of nausea, vomiting, diarrhea without fever. While awaiting ER room assignment, labs were drawn. Her CBC shows a mildly elevated white blood cell count at 11.3. CMP shows mild hypokalemia 3.0, but is otherwise reassuring. Urinalysis does show trace leukocytes, rare bacteria, and one 11-20 white blood cells however there is significant number of squamous epithelial cells. Urine pregnancy test is negative. Plan will be to give her IV fluids and Zofran and monitor.  DIFFERENTIAL DIAGNOSIS  Gastroenteritis, colitis, ileus, small bowel obstruction, constipation  ED COURSE  IV fluids infused. Even after Zofran, patient complained of feeling nauseated. She was given Phenergan with complete relief of abdominal pain and nausea. While here in the emergency department, she did not have any vomiting. She passed a p.o. challenge. She will be given a prescription for Phenergan and a work note for the next 2 days. She was tested for COVID-19 which is still pending. She is to schedule follow-up appointment with  her primary care provider if symptoms are not improving. She is to return to the emergency department for symptoms change or worsen if she is unable schedule appointment. ____________________________________________   FINAL CLINICAL IMPRESSION(S) / ED DIAGNOSES  Final diagnoses:  Nausea vomiting and diarrhea     ED Discharge Orders         Ordered    promethazine (PHENERGAN) 12.5 MG tablet  Every 6 hours PRN     04/21/19 1836           Christina Cordova was evaluated in Emergency Department on 04/21/2019 for the symptoms described in the history of present illness. She was evaluated in the context of the global COVID-19 pandemic, which necessitated consideration that the patient might be at risk for infection with the SARS-CoV-2 virus that causes COVID-19. Institutional protocols and algorithms that pertain to the evaluation of patients at risk for COVID-19 are in a state of rapid change based on information released by regulatory bodies including the CDC and federal and state organizations. These policies and algorithms were followed during the patient's care in the ED.   Note:  This document was prepared using Dragon voice recognition software and may include unintentional dictation errors.  Victorino Dike, FNP 04/21/19 2102    Carrie Mew, MD 04/24/19 1725

## 2019-04-21 NOTE — ED Notes (Signed)
Pt ambulating to bathroom, no c/o dizziness. Pt wheeled to lobby to wait for family.

## 2019-04-21 NOTE — ED Triage Notes (Signed)
Pt presents to ED via POV with c/o lower abdominal pain, N/V/D. Pt states pain x 3 days, states pain 9/10 at this time. Pt denies urinary symptoms, states multiple episodes of emesis and approx 5 episodes of diarrhea in the last 24 hrs.

## 2019-04-22 LAB — SARS CORONAVIRUS 2 (TAT 6-24 HRS): SARS Coronavirus 2: NEGATIVE

## 2020-08-06 ENCOUNTER — Encounter: Payer: Self-pay | Admitting: Family Medicine

## 2020-08-06 ENCOUNTER — Other Ambulatory Visit: Payer: Self-pay

## 2020-08-06 ENCOUNTER — Ambulatory Visit: Payer: Self-pay | Admitting: Family Medicine

## 2020-08-06 DIAGNOSIS — Z113 Encounter for screening for infections with a predominantly sexual mode of transmission: Secondary | ICD-10-CM

## 2020-08-06 LAB — WET PREP FOR TRICH, YEAST, CLUE
Trichomonas Exam: NEGATIVE
Yeast Exam: NEGATIVE

## 2020-08-06 NOTE — Progress Notes (Signed)
[facility] Department STI clinic/screening visit  Subjective:  Christina Cordova is a 35 y.o. female being seen today for an STI screening visit. The patient reports they do not have symptoms.  Patient reports that they do not desire a pregnancy in the next year.   They reported they are not interested in discussing contraception today.  Patient's last menstrual period was 08/03/2020.   Patient has the following medical conditions:  There are no problems to display for this patient.   Chief Complaint  Patient presents with   SEXUALLY TRANSMITTED DISEASE    STD screening including bloodwork    HPI  Patient reports here for screening, declines s/sx   Last HIV test per patient/review of record was "a  few years ago"  Patient reports last pap was "also a few years ago".   See flowsheet for further details and programmatic requirements.    The following portions of the patient's history were reviewed and updated as appropriate: allergies, current medications, past medical history, past social history, past surgical history and problem list.  Objective:  There were no vitals filed for this visit.  Physical Exam Vitals and nursing note reviewed.  Constitutional:      Appearance: Normal appearance. She is obese.  HENT:     Head: Normocephalic and atraumatic.     Mouth/Throat:     Mouth: Mucous membranes are moist.     Pharynx: Oropharynx is clear. No oropharyngeal exudate or posterior oropharyngeal erythema.  Pulmonary:     Effort: Pulmonary effort is normal.  Chest:  Breasts:    Right: No axillary adenopathy or supraclavicular adenopathy.     Left: No axillary adenopathy or supraclavicular adenopathy.  Abdominal:     General: Abdomen is flat.     Palpations: There is no mass.     Tenderness: There is no abdominal tenderness. There is no rebound.  Genitourinary:    Exam position: Lithotomy position.     Pubic Area: No rash or pubic lice.      Labia:         Right: No rash or lesion.        Left: No rash or lesion.      Vagina: Normal. No vaginal discharge, erythema, bleeding or lesions.     Cervix: No cervical motion tenderness, discharge, friability, lesion or erythema.     Uterus: Normal.      Adnexa: Right adnexa normal and left adnexa normal.     Comments: Deferred, patient self collected  Lymphadenopathy:     Head:     Right side of head: No preauricular or posterior auricular adenopathy.     Left side of head: No preauricular or posterior auricular adenopathy.     Cervical: No cervical adenopathy.     Upper Body:     Right upper body: No supraclavicular or axillary adenopathy.     Left upper body: No supraclavicular or axillary adenopathy.     Lower Body: No right inguinal adenopathy. No left inguinal adenopathy.  Skin:    General: Skin is warm and dry.     Findings: No rash.  Neurological:     Mental Status: She is alert and oriented to person, place, and time.  Psychiatric:        Behavior: Behavior normal.     Assessment and Plan:  Christina Cordova is a 35 y.o. female presenting to the [facility] Department for STI screening  1. Screening examination for venereal disease  -  Syphilis Serology, [place] [facility] - WET PREP FOR TRICH, YEAST, CLUE - Chlamydia/Gonorrhea [place] [facility] - HIV/HCV [place] [facility] - HBV Antigen/Antibody [facility] - Gonococcus culture  Patient accepted all screenings including wet prep, oral, GC,  vaginal CT/GC and bloodwork for HIV/RPR.  Patient meets criteria for HepB screening? Yes. Ordered? Yes Patient meets criteria for HepC screening? Yes. Ordered? Yes  Wet prep results neg  No Treatment needed  Discussed time line for State [facility] results and that patient will be called with positive results and encouraged patient to call if she had not heard in 2 weeks.  Counseled to return or seek care for continued or worsening symptoms Recommended condom use with all sex  Patient is currently  using  no BCM   to prevent pregnancy.     Return for as needed.  No future appointments.  Wendi Snipes, FNP

## 2020-08-06 NOTE — Progress Notes (Signed)
Pt here for STD screening.  Wet mount results reviewed, no treatment required.  Ambyr Qadri M Jackolyn Geron, RN ? ?

## 2020-08-10 LAB — HM HIV SCREENING LAB: HM HIV Screening: NEGATIVE

## 2020-08-10 LAB — HM HEPATITIS C SCREENING LAB: HM Hepatitis Screen: NEGATIVE

## 2020-08-12 LAB — GONOCOCCUS CULTURE

## 2021-09-30 ENCOUNTER — Ambulatory Visit (LOCAL_COMMUNITY_HEALTH_CENTER): Payer: Self-pay | Admitting: Nurse Practitioner

## 2021-09-30 ENCOUNTER — Encounter: Payer: Self-pay | Admitting: Advanced Practice Midwife

## 2021-09-30 VITALS — BP 129/76 | Ht 65.0 in | Wt >= 6400 oz

## 2021-09-30 DIAGNOSIS — A599 Trichomoniasis, unspecified: Secondary | ICD-10-CM

## 2021-09-30 DIAGNOSIS — Z3202 Encounter for pregnancy test, result negative: Secondary | ICD-10-CM

## 2021-09-30 DIAGNOSIS — Z3009 Encounter for other general counseling and advice on contraception: Secondary | ICD-10-CM

## 2021-09-30 DIAGNOSIS — Z113 Encounter for screening for infections with a predominantly sexual mode of transmission: Secondary | ICD-10-CM

## 2021-09-30 DIAGNOSIS — Z01419 Encounter for gynecological examination (general) (routine) without abnormal findings: Secondary | ICD-10-CM

## 2021-09-30 MED ORDER — METRONIDAZOLE 500 MG PO TABS: 500.0000 mg | ORAL_TABLET | Freq: Two times a day (BID) | ORAL | 0 refills | Status: AC

## 2021-09-30 MED ORDER — LEVONORGESTREL 1.5 MG PO TABS: 1.5000 mg | ORAL_TABLET | Freq: Once | ORAL | 0 refills | Status: DC

## 2021-09-30 NOTE — Progress Notes (Signed)
[facility] DEPARTMENT [facility] [address] Main Number: [phone]    Family Planning Visit- Initial Visit  Subjective:  Christina Cordova is a 36 y.o.  G1P0   being seen today for an initial annual visit and to discuss reproductive life planning.  The patient is currently using No Method - Other Reason for pregnancy prevention. Patient reports   does not want a pregnancy in the next year.     report they are looking for a method that provides High efficacy at preventing pregnancy  Patient has the following medical conditions does not have a problem list on file.  Chief Complaint  Patient presents with   Contraception    Physical, STD screening and patient would like to get the IUD     Patient reports to clinic today for a physical, STD screening, and to discuss birth control options.      Body mass index is 68.06 kg/m. - Patient is eligible for diabetes screening based on BMI and age >35?  not applicable HA1C ordered? not applicable  Patient reports 1  partner/s in last year. Desires STI screening?  Yes  Has patient been screened once for HCV in the past?  Yes  No results found for: "HCVAB"  Does the patient have current drug use (including MJ), have a partner with drug use, and/or has been incarcerated since last result? Yes  If yes-- Screen for HCV through [facility]   Does the patient meet criteria for HBV testing? Yes  Criteria:  -Household, sexual or needle sharing contact with HBV -History of drug use -HIV positive -Those with known Hep C   Health Maintenance Due  Topic Date Due   COVID-19 Vaccine (1) Never done   TETANUS/TDAP  Never done   PAP SMEAR-Modifier  Never done   INFLUENZA VACCINE  09/14/2021    Review of Systems  Constitutional:  Negative for chills, fever, malaise/fatigue and weight loss.  HENT:  Negative for congestion, hearing loss and sore throat.   Eyes:  Negative for blurred vision, double  vision and photophobia.  Respiratory:  Negative for shortness of breath.   Cardiovascular:  Negative for chest pain.  Gastrointestinal:  Positive for nausea. Negative for abdominal pain, blood in stool, constipation, diarrhea, heartburn and vomiting.  Genitourinary:  Negative for dysuria and frequency.  Musculoskeletal:  Negative for back pain, joint pain and neck pain.  Skin:  Negative for itching and rash.  Neurological:  Negative for dizziness, weakness and headaches.  Endo/Heme/Allergies:  Does not bruise/bleed easily.  Psychiatric/Behavioral:  Negative for depression, substance abuse and suicidal ideas.     The following portions of the patient's history were reviewed and updated as appropriate: allergies, current medications, past family history, past medical history, past social history, past surgical history and problem list. Problem list updated.   See flowsheet for other program required questions.  Objective:   Vitals:   09/30/21 1600  BP: 129/76  Weight: (!) 409 lb (185.5 kg)  Height: 5\' 5"  (1.651 m)    Physical Exam Constitutional:      Appearance: Normal appearance. She is obese.  HENT:     Head: Normocephalic. No abrasion, masses or laceration. Hair is normal.     Jaw: No tenderness or swelling.     Right Ear: External ear normal.     Left Ear: External ear normal.     Nose: Nose normal.     Mouth/Throat:     Lips:  Pink. No lesions.     Mouth: Mucous membranes are moist. No lacerations or oral lesions.     Dentition: No dental caries.     Tongue: No lesions.     Palate: No mass and lesions.     Pharynx: No pharyngeal swelling, oropharyngeal exudate, posterior oropharyngeal erythema or uvula swelling.     Tonsils: No tonsillar exudate or tonsillar abscesses.  Eyes:     Pupils: Pupils are equal, round, and reactive to light.  Neck:     Thyroid: No thyroid mass, thyromegaly or thyroid tenderness.  Cardiovascular:     Rate and Rhythm: Normal rate and regular  rhythm.  Pulmonary:     Effort: Pulmonary effort is normal.     Breath sounds: Normal breath sounds.  Chest:  Breasts:    Right: Normal. No swelling, mass, nipple discharge, skin change or tenderness.     Left: Normal. No swelling, mass, nipple discharge, skin change or tenderness.  Abdominal:     General: Abdomen is flat. Bowel sounds are normal.     Palpations: Abdomen is soft.     Tenderness: There is no abdominal tenderness. There is no rebound.  Genitourinary:    Pubic Area: No rash or pubic lice.      Labia:        Right: No rash, tenderness or lesion.        Left: No rash, tenderness or lesion.      Vagina: Normal. No vaginal discharge, erythema, tenderness or lesions.     Cervix: No cervical motion tenderness, discharge, lesion or erythema.     Uterus: Normal.      Adnexa:        Right: No tenderness.         Left: No tenderness.       Rectum: Normal.     Comments: Amount Discharge: small  Odor: Yes pH: greater than 4.5 Adheres to vaginal wall: No Color: red, spotting Musculoskeletal:     Cervical back: Full passive range of motion without pain and normal range of motion.  Lymphadenopathy:     Cervical: No cervical adenopathy.     Right cervical: No superficial, deep or posterior cervical adenopathy.    Left cervical: No superficial, deep or posterior cervical adenopathy.     Upper Body:     Right upper body: No supraclavicular, axillary or epitrochlear adenopathy.     Left upper body: No supraclavicular, axillary or epitrochlear adenopathy.     Lower Body: No right inguinal adenopathy. No left inguinal adenopathy.  Skin:    General: Skin is warm and dry.     Findings: No erythema, laceration, lesion or rash.  Neurological:     Mental Status: She is alert and oriented to person, place, and time.  Psychiatric:        Attention and Perception: Attention normal.        Mood and Affect: Mood normal.        Speech: Speech normal.        Behavior: Behavior normal.  Behavior is cooperative.       Assessment and Plan:  Christina Cordova is a 36 y.o. female presenting to the [facility] Department for an initial annual wellness/contraceptive visit  Contraception counseling: Reviewed options based on patient desire and reproductive life plan. Patient is interested in IUD or IUS. This was not provided to the patient today.  This was not provided today due to time restraints.    Risks, benefits,  and typical effectiveness rates were reviewed.  Questions were answered.  Written information was also given to the patient to review.    The patient will follow up in  1 years for surveillance.  The patient was told to call with any further questions, or with any concerns about this method of contraception.  Emphasized use of condoms 100% of the time for STI prevention.  Need for ECP was assessed. ECP offered to patient.  Patient to return back to clinic for ECP or encouraged to pick up from pharmacy.  1. Screening examination for venereal disease -STD screening today. -Patient accepted all screenings including oral, vaginal, rectal CT/GC and bloodwork for HIV/RPR.  Patient meets criteria for HepB screening? Yes. Ordered? Yes Patient meets criteria for HepC screening? Yes. Ordered? Yes  Treat wet prep per standing order Discussed time line for State Lab results and that patient will be called with positive results and encouraged patient to call if she had not heard in 2 weeks.  Counseled to return or seek care for continued or worsening symptoms Recommended condom use with all sex  Patient is currently not using  contraception  to prevent pregnancy.    - [facility] - HIV/HCV [place] [facility] - Syphilis Serology, [place] [facility] - HBV Antigen/Antibody State Lab - WET PREP FOR [place], YEAST, CLUE  2. Well woman exam with routine gynecological exam -Normal well  woman exam. -PAP performed today. -CBE today, next due 09/2024 - IGP, Aptima HPV  3. Family planning counseling -36 year old female in clinic today for a physical, STD screening, and to discuss birth control options.  Patient is interested in an IUD.  Due to time restraints patient advised to schedule an appointment for IUD placement.  Patient advised to use condoms with all sex until placement of IUD.   -ROS reviewed.  Patient with complaints of nausea.  Patient advised to increase protein to assist with nausea and eat small frequent meals.  -Patient desires PT today, PT negative. -Patient had unprotected sex on 09/27/21.  Plan B offered, patient agrees to treatment.  - Pregnancy, urine  4. Trichimoniasis -Wet mount reviewed.  Treat patient for Trich.  - metroNIDAZOLE (FLAGYL) 500 MG tablet; Take 1 tablet (500 mg total) by mouth 2 (two) times daily for 7 days.  Dispense: 14 tablet; Refill: 0   Total time spent: 30 minutes   Return in about 1 year (around 10/01/2022) for Annual well-woman exam.    Gregary Cromer, FNP

## 2021-09-30 NOTE — Progress Notes (Signed)
WET PREP reveals Trichomonas. Pt treated per standing orders. UPT negative. Pt verbalized understanding of further labwork pending. Contact card given. Pt instructed to make appointment for IUD placement after treatment for trichomonas complete. Verbalized understanding. Lethea Killings RN

## 2021-10-01 LAB — HM HIV SCREENING LAB: HM HIV Screening: NEGATIVE

## 2021-10-01 LAB — WET PREP FOR TRICH, YEAST, CLUE
Trichomonas Exam: POSITIVE — AB
Yeast Exam: NEGATIVE

## 2021-10-01 LAB — PREGNANCY, URINE: Preg Test, Ur: NEGATIVE

## 2021-10-01 LAB — HM HEPATITIS C SCREENING LAB: HM Hepatitis Screen: NEGATIVE

## 2021-10-01 LAB — HEPATITIS B SURFACE ANTIGEN: Hepatitis B Surface Ag: NONREACTIVE

## 2021-10-07 LAB — IGP, APTIMA HPV
HPV Aptima: NEGATIVE
PAP Smear Comment: 0

## 2022-01-19 ENCOUNTER — Other Ambulatory Visit: Payer: Self-pay

## 2022-01-19 ENCOUNTER — Emergency Department
Admission: EM | Admit: 2022-01-19 | Discharge: 2022-01-19 | Disposition: A | Discharge status: Home / Self Care | Payer: Managed Care, Other (non HMO) | Attending: Emergency Medicine | Admitting: Emergency Medicine

## 2022-01-19 ENCOUNTER — Emergency Department: Payer: Managed Care, Other (non HMO)

## 2022-01-19 DIAGNOSIS — Z20822 Contact with and (suspected) exposure to covid-19: Secondary | ICD-10-CM | POA: Diagnosis not present

## 2022-01-19 DIAGNOSIS — R059 Cough, unspecified: Secondary | ICD-10-CM | POA: Diagnosis present

## 2022-01-19 DIAGNOSIS — J101 Influenza due to other identified influenza virus with other respiratory manifestations: Secondary | ICD-10-CM | POA: Diagnosis not present

## 2022-01-19 LAB — RESP PANEL BY RT-PCR (FLU A&B, COVID) ARPGX2
Influenza A by PCR: NEGATIVE
Influenza B by PCR: POSITIVE — AB
SARS Coronavirus 2 by RT PCR: NEGATIVE

## 2022-01-19 MED ORDER — ALBUTEROL SULFATE HFA 108 (90 BASE) MCG/ACT IN AERS: 2.0000 | INHALATION_SPRAY | Freq: Four times a day (QID) | RESPIRATORY_TRACT | 2 refills | Status: AC | PRN

## 2022-01-19 MED ORDER — ALBUTEROL SULFATE HFA 108 (90 BASE) MCG/ACT IN AERS: 2.0000 | INHALATION_SPRAY | Freq: Four times a day (QID) | RESPIRATORY_TRACT | Status: DC

## 2022-01-19 NOTE — ED Triage Notes (Signed)
Pt comes with c/o generalized body aches and hot flashes. Pt states son recently dx with flu.

## 2022-01-19 NOTE — ED Provider Notes (Signed)
[facility] Provider Note    Event Date/Time   First MD Initiated Contact with Patient 01/19/22 1017     (approximate)   History   Chief Complaint Generalized Body Aches   HPI Christina Cordova is a 36 y.o. female, history of substance abuse, presents to the emergency department for evaluation of flulike symptoms.  She states that she was recently around a family member who was diagnosed with flu.  Over the past couple days, she has developed hot flashes, body aches, and cough/congestion.  It is endorses some shortness of breath as well.  Denies chest pain, abdominal pain, flank pain, nausea/vomiting, rash/lesions, dizziness/lightheadedness, headache, or weakness.  History Limitations: No limitations.        Physical Exam  Triage Vital Signs: ED Triage Vitals [01/19/22 0943]  Enc Vitals Group     BP      Pulse      Resp      Temp      Temp src      SpO2      Weight      Height      Head Circumference      Peak Flow      Pain Score 8     Pain Loc      Pain Edu?      Excl. in GC?     Most recent vital signs: Vitals:   01/19/22 1029  BP: 128/81  Pulse: 97  Resp: 20  Temp: 98.4 F (36.9 C)  SpO2: 100%    General: Awake, NAD.  Skin: Warm, dry. No rashes or lesions.  Eyes: PERRL. Conjunctivae normal.  CV: Good peripheral perfusion.  Resp: Normal effort.  Lung sounds are clear bilaterally.  Throat exam unremarkable. Abd: Soft, non-tender. No distention.  Neuro: At baseline. No gross neurological deficits.  Musculoskeletal: Normal ROM of all extremities.   Physical Exam    ED Results / Procedures / Treatments  Labs (all labs ordered are listed, but only abnormal results are displayed) Labs Reviewed  RESP PANEL BY RT-PCR (FLU A&B, COVID) ARPGX2 - Abnormal; Notable for the following components:      Result Value   Influenza B by PCR POSITIVE (*)    All other components within normal limits      EKG N/A.    RADIOLOGY  ED Provider Interpretation: I personally reviewed and interpreted this x-ray, no evidence of pneumonia.  DG Chest 2 View  Result Date: 01/19/2022 CLINICAL DATA:  Rule out pneumonia. EXAM: CHEST - 2 VIEW COMPARISON:  07/01/2016 FINDINGS: Heart size and mediastinal contours are unremarkable. No pleural effusion or edema. No airspace opacities. Visualized osseous structures are unremarkable. IMPRESSION: No active cardiopulmonary disease. Electronically Signed   By: Signa Kell M.D.   On: 01/19/2022 11:10    PROCEDURES:  Critical Care performed: N/A.  Procedures    MEDICATIONS ORDERED IN ED: Medications - No data to display   IMPRESSION / MDM / ASSESSMENT AND PLAN / ED COURSE  I reviewed the triage vital signs and the nursing notes.                              Differential diagnosis includes, but is not limited to, influenza, COVID-19, community-acquired pneumonia, bronchitis, viral URI.   Assessment/Plan Presentation consistent with influenza, confirmed by PCR.  She appears well clinically.  Lung sounds are clear bilaterally.  X-rays not show any evidence of  pneumonia.  She is outside the timeframe for [facility].  Will provide her with a prescription for albuterol to help manage her intermittent shortness of breath.  Additionally advised her to continue taking Tylenol/ibuprofen as needed for fevers and bodyaches.  She was amenable to this plan.  Recommend that she follow-up with her primary care provider as needed.  Will discharge.  Provided the patient with anticipatory guidance, return precautions, and educational material. Encouraged the patient to return to the emergency department at any time if they begin to experience any new or worsening symptoms. Patient expressed understanding and agreed with the plan.   Patient's presentation is most consistent with acute complicated illness / injury requiring diagnostic workup.       FINAL  CLINICAL IMPRESSION(S) / ED DIAGNOSES   Final diagnoses:  Influenza A     Rx / DC Orders   ED Discharge Orders          Ordered    albuterol (VENTOLIN HFA) 108 (90 Base) MCG/ACT inhaler  Every 6 hours PRN        01/19/22 1121             Note:  This document was prepared using Dragon voice recognition software and may include unintentional dictation errors.   Varney Daily, [place] 01/19/22 1125    Minna Antis, MD 01/19/22 [phone]

## 2022-01-19 NOTE — Discharge Instructions (Addendum)
-  You may utilize the albuterol inhaler as needed if you feel short of breath.  -Please avoid contact with others is much as possible until at least after 5 days.  -For the fever and bodyaches, you may take Tylenol (up to 1000 mg every 6 hours) and/or ibuprofen (400 mg every 6 hours)  -Return to the emergency department anytime if you begin to experience any new or worsening symptoms.

## 2022-07-18 ENCOUNTER — Ambulatory Visit (LOCAL_COMMUNITY_HEALTH_CENTER): Payer: Self-pay

## 2022-07-18 DIAGNOSIS — Z111 Encounter for screening for respiratory tuberculosis: Secondary | ICD-10-CM

## 2022-07-18 DIAGNOSIS — Z23 Encounter for immunization: Secondary | ICD-10-CM

## 2022-07-18 DIAGNOSIS — Z3202 Encounter for pregnancy test, result negative: Secondary | ICD-10-CM

## 2022-07-18 LAB — PREGNANCY, URINE: Preg Test, Ur: NEGATIVE

## 2022-07-18 NOTE — Progress Notes (Signed)
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 Has Family planning Waiver Medicaid.   Meets states criteria for Tdap and Twinrix.  LMP 03/2022. Hx irreg period. No bcm. Sexually active.   Phone consult Dr Ralene Bathe who orders UPT and if negative, may proceed with Tdap and Twinrix.  RN walked pt to lab. UPT negative today.   PPD placed and Tdap and Twinrix given without problem. Updated [facility] copy given and recommended schedule explained. Jerel Shepherd, RN

## 2022-07-21 ENCOUNTER — Ambulatory Visit (LOCAL_COMMUNITY_HEALTH_CENTER): Payer: Medicaid Other

## 2022-07-21 DIAGNOSIS — Z111 Encounter for screening for respiratory tuberculosis: Secondary | ICD-10-CM

## 2022-07-21 LAB — TB SKIN TEST
Induration: 0 mm
TB Skin Test: NEGATIVE

## 2022-07-21 NOTE — Progress Notes (Signed)
Attestation of Attending Supervision of clinical support staff: I agree with the care provided to this patient and was available for any consultation.  I have reviewed the RN's note and chart. I was available for consult by phone and spoke with the nurse and documentation reflects my recommendations.   Lyndel Safe MD MPH Attending Physician Faculty Practice- [facility] for [facility]

## 2024-03-14 ENCOUNTER — Other Ambulatory Visit: Payer: Self-pay

## 2024-03-14 ENCOUNTER — Emergency Department
Admission: EM | Admit: 2024-03-14 | Discharge: 2024-03-14 | Disposition: A | Discharge status: Home / Self Care | Attending: Emergency Medicine | Admitting: Emergency Medicine

## 2024-03-14 DIAGNOSIS — S43401A Unspecified sprain of right shoulder joint, initial encounter: Secondary | ICD-10-CM | POA: Insufficient documentation

## 2024-03-14 DIAGNOSIS — W19XXXA Unspecified fall, initial encounter: Secondary | ICD-10-CM

## 2024-03-14 DIAGNOSIS — M545 Low back pain, unspecified: Secondary | ICD-10-CM | POA: Insufficient documentation

## 2024-03-14 DIAGNOSIS — W000XXA Fall on same level due to ice and snow, initial encounter: Secondary | ICD-10-CM | POA: Insufficient documentation

## 2024-03-14 DIAGNOSIS — M25511 Pain in right shoulder: Secondary | ICD-10-CM | POA: Diagnosis present

## 2024-03-14 MED ORDER — NAPROXEN 500 MG PO TABS: 500.0000 mg | ORAL_TABLET | Freq: Two times a day (BID) | ORAL | 2 refills | Status: AC

## 2024-03-14 NOTE — ED Provider Notes (Signed)
 "  [facility] Provider Note    Event Date/Time   First MD Initiated Contact with Patient 03/14/24 2027     (approximate)   History   Fall   HPI  Christina Cordova is a 39 y.o. female with no significant past medical history presents after a fall which occurred yesterday.  Patient reports she slipped on the ice.  Felt okay afterwards but this morning woke up with soreness in her low back primarily on the right.  No numbness or weakness ambulating well     Physical Exam   Triage Vital Signs: ED Triage Vitals  Encounter Vitals Group     BP 03/14/24 1717 (!) 152/84     Girls Systolic BP Percentile --      Girls Diastolic BP Percentile --      Boys Systolic BP Percentile --      Boys Diastolic BP Percentile --      Pulse Rate 03/14/24 1717 65     Resp 03/14/24 1717 16     Temp 03/14/24 1717 97.8 F (36.6 C)     Temp src --      SpO2 03/14/24 1717 94 %     Weight 03/14/24 1715 (!) 204.1 kg (450 lb)     Height 03/14/24 1715 1.676 m (5' 6)     Head Circumference --      Peak Flow --      Pain Score 03/14/24 1713 7     Pain Loc --      Pain Education --      Exclude from Growth Chart --     Most recent vital signs: Vitals:   03/14/24 1717  BP: (!) 152/84  Pulse: 65  Resp: 16  Temp: 97.8 F (36.6 C)  SpO2: 94%     General: Awake, no distress.  CV:  Good peripheral perfusion.  Resp:  Normal effort.  Abd:  No distention.  Other:  Mild right lumbar paraspinal tenderness to palpation, ambulating well, normal strength in the lower extremities, warm and well-perfused, no saddle anesthesia.   ED Results / Procedures / Treatments   Labs (all labs ordered are listed, but only abnormal results are displayed) Labs Reviewed - No data to display   EKG     RADIOLOGY     PROCEDURES:  Critical Care performed:   Procedures   MEDICATIONS ORDERED IN ED: Medications - No data to display   IMPRESSION / MDM / ASSESSMENT AND PLAN /  ED COURSE  I reviewed the triage vital signs and the nursing notes. Patient's presentation is most consistent with acute, uncomplicated illness.  Patient presents after a fall which occurred yesterday.  Overall she is well-appearing and in no acute distress.  Exam is most consistent with lumbar strain/spasm  Will treat with NSAIDs, recommend heating pad, outpatient follow-up with PCP as      FINAL CLINICAL IMPRESSION(S) / ED DIAGNOSES   Final diagnoses:  Fall, initial encounter  Sprain of right shoulder, unspecified shoulder sprain type, initial encounter     Rx / DC Orders   ED Discharge Orders          Ordered    naproxen  (NAPROSYN ) 500 MG tablet  2 times daily with meals        03/14/24 2032             Note:  This document was prepared using Dragon voice recognition software and may include unintentional dictation errors.   Marlee Trentman,  [place], MD 03/14/24 2235  "

## 2024-03-14 NOTE — ED Notes (Signed)
 Discharge paperwork discussed with the patient and all questioned fully answered.

## 2024-03-14 NOTE — ED Triage Notes (Signed)
 Pt comes in via pov after slipping on ice yesterday at the grocery store. Pt complains of headache, back pain, right shoulder and right hip pain. Pt denies LOC, but believes she hit her head. Pt ambulatory in triage, with no signs of acute distress at this time. Pt complains of pain 7/10 at this time.

## 2024-04-26 ENCOUNTER — Ambulatory Visit: Admitting: Nurse Practitioner
